# Patient Record
Sex: Female | Born: 1965 | Race: Black or African American | Hispanic: No | Marital: Married | Smoking: Never smoker
Health system: Southern US, Community
[De-identification: ages and names within clinical notes are randomized; demographics above are authoritative.]

## PROBLEM LIST (undated history)

## (undated) DIAGNOSIS — Z86711 Personal history of pulmonary embolism: Secondary | ICD-10-CM

## (undated) DIAGNOSIS — Z950 Presence of cardiac pacemaker: Secondary | ICD-10-CM

## (undated) DIAGNOSIS — I10 Essential (primary) hypertension: Secondary | ICD-10-CM

## (undated) DIAGNOSIS — J45909 Unspecified asthma, uncomplicated: Secondary | ICD-10-CM

## (undated) DIAGNOSIS — E66811 Obesity, class 1: Secondary | ICD-10-CM

## (undated) DIAGNOSIS — I455 Other specified heart block: Secondary | ICD-10-CM

## (undated) DIAGNOSIS — D869 Sarcoidosis, unspecified: Secondary | ICD-10-CM

## (undated) DIAGNOSIS — E119 Type 2 diabetes mellitus without complications: Secondary | ICD-10-CM

## (undated) DIAGNOSIS — E669 Obesity, unspecified: Secondary | ICD-10-CM

## (undated) DIAGNOSIS — Z7901 Long term (current) use of anticoagulants: Secondary | ICD-10-CM

## (undated) HISTORY — DX: Obesity, unspecified: E66.9

## (undated) HISTORY — DX: Long term (current) use of anticoagulants: Z79.01

## (undated) HISTORY — DX: Personal history of pulmonary embolism: Z86.711

## (undated) HISTORY — DX: Type 2 diabetes mellitus without complications: E11.9

## (undated) HISTORY — DX: Unspecified asthma, uncomplicated: J45.909

## (undated) HISTORY — PX: PACEMAKER IMPLANT: EP1218

## (undated) HISTORY — PX: ABDOMINAL HYSTERECTOMY: SHX81

## (undated) HISTORY — DX: Essential (primary) hypertension: I10

## (undated) HISTORY — DX: Obesity, class 1: E66.811

## (undated) HISTORY — PX: ANTERIOR CRUCIATE LIGAMENT REPAIR: SHX115

## (undated) HISTORY — PX: KNEE SURGERY: SHX244

## (undated) HISTORY — DX: Presence of cardiac pacemaker: Z95.0

## (undated) HISTORY — PX: PACEMAKER INSERTION: SHX728

## (undated) HISTORY — DX: Other specified heart block: I45.5

---

## 1997-07-09 ENCOUNTER — Observation Stay (HOSPITAL_COMMUNITY): Admission: AD | Admit: 1997-07-09 | Discharge: 1997-07-10 | Payer: Self-pay | Admitting: Obstetrics

## 1997-07-17 ENCOUNTER — Ambulatory Visit (HOSPITAL_COMMUNITY): Admission: RE | Admit: 1997-07-17 | Discharge: 1997-07-17 | Payer: Self-pay | Admitting: Obstetrics and Gynecology

## 1997-08-06 ENCOUNTER — Other Ambulatory Visit: Admission: RE | Admit: 1997-08-06 | Discharge: 1997-08-06 | Payer: Self-pay | Admitting: Obstetrics and Gynecology

## 1999-08-13 ENCOUNTER — Encounter: Payer: Self-pay | Admitting: Emergency Medicine

## 1999-08-13 ENCOUNTER — Emergency Department (HOSPITAL_COMMUNITY): Admission: EM | Admit: 1999-08-13 | Discharge: 1999-08-13 | Payer: Self-pay | Admitting: Emergency Medicine

## 1999-10-13 ENCOUNTER — Other Ambulatory Visit: Admission: RE | Admit: 1999-10-13 | Discharge: 1999-10-13 | Payer: Self-pay | Admitting: Obstetrics and Gynecology

## 1999-11-10 ENCOUNTER — Emergency Department (HOSPITAL_COMMUNITY): Admission: EM | Admit: 1999-11-10 | Discharge: 1999-11-10 | Payer: Self-pay | Admitting: Emergency Medicine

## 1999-11-10 ENCOUNTER — Encounter: Payer: Self-pay | Admitting: Emergency Medicine

## 2000-09-07 ENCOUNTER — Emergency Department (HOSPITAL_COMMUNITY): Admission: EM | Admit: 2000-09-07 | Discharge: 2000-09-07 | Payer: Self-pay | Admitting: Emergency Medicine

## 2000-12-16 ENCOUNTER — Encounter: Admission: RE | Admit: 2000-12-16 | Discharge: 2000-12-16 | Payer: Self-pay | Admitting: Internal Medicine

## 2000-12-16 ENCOUNTER — Encounter: Payer: Self-pay | Admitting: Internal Medicine

## 2001-01-12 ENCOUNTER — Other Ambulatory Visit: Admission: RE | Admit: 2001-01-12 | Discharge: 2001-01-12 | Payer: Self-pay | Admitting: Obstetrics and Gynecology

## 2001-03-08 ENCOUNTER — Emergency Department (HOSPITAL_COMMUNITY): Admission: EM | Admit: 2001-03-08 | Discharge: 2001-03-08 | Payer: Self-pay | Admitting: Emergency Medicine

## 2001-03-08 ENCOUNTER — Encounter: Payer: Self-pay | Admitting: Emergency Medicine

## 2001-06-07 ENCOUNTER — Encounter: Payer: Self-pay | Admitting: Emergency Medicine

## 2001-06-07 ENCOUNTER — Inpatient Hospital Stay (HOSPITAL_COMMUNITY): Admission: EM | Admit: 2001-06-07 | Discharge: 2001-06-13 | Payer: Self-pay | Admitting: Emergency Medicine

## 2001-06-10 ENCOUNTER — Encounter: Payer: Self-pay | Admitting: Internal Medicine

## 2002-05-02 ENCOUNTER — Encounter: Admission: RE | Admit: 2002-05-02 | Discharge: 2002-07-31 | Payer: Self-pay | Admitting: Internal Medicine

## 2002-06-22 ENCOUNTER — Other Ambulatory Visit: Admission: RE | Admit: 2002-06-22 | Discharge: 2002-06-22 | Payer: Self-pay | Admitting: Obstetrics and Gynecology

## 2003-02-04 ENCOUNTER — Ambulatory Visit (HOSPITAL_BASED_OUTPATIENT_CLINIC_OR_DEPARTMENT_OTHER): Admission: RE | Admit: 2003-02-04 | Discharge: 2003-02-04 | Payer: Self-pay | Admitting: Internal Medicine

## 2004-12-22 ENCOUNTER — Other Ambulatory Visit: Admission: RE | Admit: 2004-12-22 | Discharge: 2004-12-22 | Payer: Self-pay | Admitting: Obstetrics and Gynecology

## 2005-10-21 ENCOUNTER — Encounter (INDEPENDENT_AMBULATORY_CARE_PROVIDER_SITE_OTHER): Payer: Self-pay | Admitting: Specialist

## 2005-10-21 ENCOUNTER — Inpatient Hospital Stay (HOSPITAL_COMMUNITY): Admission: RE | Admit: 2005-10-21 | Discharge: 2005-10-23 | Payer: Self-pay | Admitting: Obstetrics and Gynecology

## 2007-02-28 ENCOUNTER — Encounter: Admission: RE | Admit: 2007-02-28 | Discharge: 2007-02-28 | Payer: Self-pay | Admitting: Internal Medicine

## 2010-08-08 NOTE — Discharge Summary (Signed)
Jennifer Richards, Jennifer Richards                ACCOUNT NO.:  [id number]   MEDICAL RECORD NO.:  [id number]          PATIENT TYPE:  INP   LOCATION:  9312                          FACILITY:  [facility]   PHYSICIAN:  Guy Sandifer. Henderson Cloud, M.D. DATE OF BIRTH:  06-26-1965   DATE OF ADMISSION:  10/21/2005  DATE OF DISCHARGE:  10/23/2005                                 DISCHARGE SUMMARY   ADMISSION DIAGNOSIS:  Leiomyomata.   DISCHARGE DIAGNOSES:  1.  Leiomyomata.  2.  Right ovarian cyst.   PROCEDURE:  On October 21, 2005, total abdominal hysterectomy with right  salpingo-oophorectomy.   REASON FOR ADMISSION:  This patient is a 45 year old single black female,  G1, P0 with a known uterine leiomyomata.  Details are dictated in the  history and physical.  She is admitted for surgical management.   HOSPITAL COURSE:  The patient was admitted to the hospital and undergoes the  above procedure.  That evening of surgery, she has good pain relief, no  nausea or vomiting, and is ambulating.  Vital signs are stable.  She is  afebrile, with clear urine output, although somewhat concentrated.  She is  given an IV fluid bolus.  On the first postoperative day, her Lovenox is  resumed.  She has compression stockings and sequential compression stockings  in place.  She remains afebrile with stable vital signs.  Hemoglobin is 8.4,  and white count is 3.9.  She is tolerating a regular diet and ambulating  well but not yet passing flatus.  On the day of discharge, she has good  bowel function, is feeling good, tolerating a regular diet, and is afebrile  with stable vital signs.  Incision is healing well.  Staples are removed,  and Steri-Strips are left in place.   CONDITION ON DISCHARGE:  Good.   DIET:  Regular, as tolerated.   ACTIVITY:  No lifting, no operation of motor vehicles, no vaginal entry.  She is to call the office for problems including but not limited to,  temperature of 101 degrees, heavy vaginal bleeding,  persistent nausea and  vomiting, or increasing pain.   DISCHARGE MEDICATIONS:  1.  Percocet 5/325 mg, #40, one to two p.o. q.6h. p.r.n.  2.  Chromagen, #30, one p.o. daily with one refill.  3.  She will resume Lovenox per Dr. Diamantina Providence orders.   FOLLOW UP:  Followup is in the office in two weeks with Dr. Henderson Cloud.      Guy Sandifer Henderson Cloud, M.D.  Electronically Signed     JET/MEDQ  D:  10/23/2005  T:  10/23/2005  Job:  [id number]

## 2010-08-08 NOTE — H&P (Signed)
Jennifer Richards, Jennifer Richards                ACCOUNT NO.:  [id number]   MEDICAL RECORD NO.:  [id number]          PATIENT TYPE:  AMB   LOCATION:  SDC                           [facility]ILITY:  [facility]   PHYSICIAN:  Guy Sandifer. Henderson Cloud, M.D. DATE OF BIRTH:  08-17-1965   DATE OF ADMISSION:  10/21/2005  DATE OF DISCHARGE:                                HISTORY & PHYSICAL   CHIEF COMPLAINT:  Uterine fibroids.   HISTORY OF PRESENT ILLNESS:  This patient is a 45 year old single black  female, G1, P0 with known uterine myelomata.  Ultrasound on 10/06/2005  revealed growth in her uterus which now measures 11.6 x 5.6 x 7.5 cm.  There  are multiple leiomyomata noted.  In addition, there is a 5.3-cm cystic mass  of the right ovary.  She has continued issues with painful heavy menses and  painful intercourse.  After discussion of the options, she is being admitted  for total abdominal hysterectomy and probable right salpingo-oophorectomy.  Potential risks and complications have been reviewed pre-operatively.  The  patient has a history of bilateral pulmonary embolism and has been  anticoagulated on Coumadin.  She has recently changed to Lovenox which was  discontinued prior to surgery.  Discussed with patient the increased risk of  a possibly fatal pulmonary embolism as well.  All questions answered.   PAST MEDICAL HISTORY:  1.  History of bilateral pulmonary embolism as above.  2.  Mitral valve prolapse.  3.  Pacemaker placed 1998.  4.  Chronic hypertension.  5.  Hyperlipidemia.  6.  Diabetes.   PAST SURGICAL HISTORY:  1.  Ectopic pregnancy, 1999.  2.  Pacemaker placed 1988.  3.  ACL reconstruction.   OBSTETRIC HISTORY:  Ectopic pregnancy x1.   FAMILY HISTORY:  Chronic hypertension in mother and father.   MEDICATIONS:  1.  Lovenox 80 mg.  2.  Benazepril.  3.  Metformin.   ALLERGIES:  No known drug allergies.   SOCIAL HISTORY:  Denies tobacco, alcohol or drug abuse.   REVIEW OF SYSTEMS:  NEURO:   Denies headache.  CARDIO:  Denies chest pain.  PULMONARY:  Denies shortness of breath.  GI:  Denies recent changes in bowel  habits.   PHYSICAL EXAMINATION:  VITAL SIGNS:  Height 5 feet 5-1/2 inches.  Weight 181-  1/2 pounds.  Blood pressure 124/88.  HEENT:  Without thyromegaly.  LUNGS:  Clear to auscultation.  HEART:  Regular rate and rhythm.  BACK:  Without CVA tenderness.  BREAST:  Without mass, traction, discharge.  ABDOMEN:  Soft, nontender without masses.  PELVIC:  Long vagina, cervix without lesion.  Uterus is enlarged and  irregular in contour, at least 10 weeks in size.  Adnexal exam compromised.  EXTREMITIES:  Grossly within normal limits.  NEUROLOGICAL:  Grossly within normal limits.   ASSESSMENT:  Uterine leiomyomata.   PLAN:  Total abdominal hysterectomy with probable right salpingo-  oophorectomy.      Guy Sandifer Henderson Cloud, M.D.  Electronically Signed     JET/MEDQ  D:  10/19/2005  T:  10/19/2005  Job:  [id number]

## 2010-08-08 NOTE — Op Note (Signed)
Jennifer Richards, Jennifer Richards                ACCOUNT NO.:  [id number]   MEDICAL RECORD NO.:  [id number]          PATIENT TYPE:  INP   LOCATION:  9312                          [facility]ILITY[facility]   PHYSICIAN:  Guy Sandifer. Henderson Cloud, M.D. DATE OF BIRTH:  1965/11/16   DATE OF PROCEDURE:  10/21/2005  DATE OF DISCHARGE:                                 OPERATIVE REPORT   PREOPERATIVE DIAGNOSIS:  Uterine leiomyomata.   POSTOPERATIVE DIAGNOSES:  1.  Leiomyomata.  2.  Right ovarian cyst.   PROCEDURE:  Total abdominal hysterectomy with right salpingo-oophorectomy.   SURGEON:  Guy Sandifer. Henderson Cloud, M.D.   ASSISTANT:  Duke Salvia. Marcelle Overlie, M.D.   ANESTHESIA:  General endotracheal intubation.   SPECIMENS:  Uterus, right tube and ovary to pathology.   ESTIMATED BLOOD LOSS:  350 mL.   INDICATIONS AND CONSENT:  This patient is a 45 year old single black female,  G1, P0 with known uterine leiomyomata.  Details are dictated in the history  and physical.  Total abdominal hysterectomy, probable right salpingo-  oophorectomy, possible left salpingo-oophorectomy if abnormal has been  discussed.  Potential risks and complications have been discussed  preoperatively including, but limited to, infection, bowel, bladder,  ureteral damage, bleeding requiring transfusion of blood products with  possible transfusion reaction, HIV and hepatitis acquisition, DVT, PE,  pneumonia.  All questions were answered and consent is signed on the chart.   PROCEDURE:  The patient is taken to the operating room where she is  identified, placed in dorsal supine position.  Thigh-high compression hose  as well as sequential compression stockings are  in placed.  General  anesthesia is then induced via endotracheal intubation.  She is then prepped  abdominally and vaginally, Foley catheter is placed and the bladder is  drained and she is draped in sterile fashion.  Pfannenstiel incision is made  and dissection is carried out in layers to the  peritoneum.  Peritoneum is  incised, extended superiorly and inferiorly.  O'Connor-O'Sullivan retractors  placed.  The bladder blade is placed.  The bowel is packed away and an  infant blade is placed.  Inspection reveals the left ovary to be small but  otherwise normal.  The right ovary contains a 4-5 cm smooth cyst.  It has  some filmy adhesions to the right pelvic sidewall.  The right fallopian tube  is also slightly distended consistent with a hydrosalpinx.  The uterus  contains a 5 cm subserosal fundal fibroid as well as a 8-10 cm fibroid on  the posterior lower uterine segment.  Clamps are placed on the proximal  ligaments.  The left round ligament is identified, ligated with 0 Monocryl.  All suture will be 0 Monocryl was otherwise designated.  Left round ligament  is taken down as well as the anterior leaf of the broad ligament.  The left  ureter is seen to be well clear of the area of surgery.  The posterior leaf  is perforated, proximal ligaments are crossclamped, taken down and the  pedicle is doubly ligated first free tie then with a suture.  The upper  branches  of the left uterine artery are then skeletonized and taken down.  The right round ligament is then on ligated and  taken down in a similar  fashion.  The anterior leaf of the broad ligament is taken down sharply.  Retroperitoneal space is bluntly dissected and the ureters identified well  clear of the area of surgery.  The right ovary is elevated with sharp and  blunt dissection.  The cyst remains intact.  The right infundibulopelvic  ligament is then doubly ligated with two free ties.  It is then taken down.  Progressive bites are taken bilaterally to control the uterine vessels.  In  order to create enough space for visualization, the fundal fibroid is then  sharply resected.  The posterior fibroid is also removed by essentially  doing a myomectomy.  This allows good visualization of the remaining cervix.  Progressive  bites are taken bilaterally.  The bladder is advanced sharply  and bluntly.  Curved clamps are then used to enter the vaginal fornix  bilaterally.  Cervix is then cut free and intact.  Cuff is closed with  figure-of-eights.  Uterosacral ligaments are plicated in the midline with a  single distal suture.  The left ovary is then ligated to the left round  ligament.  Copious irrigation is then carried out and careful inspection  reveals excellent hemostasis all around.  Packs are removed and anterior  peritoneum is closed in running fashion with 2-0 Monocryl suture which is  also used to reapproximate the pyramidalis muscle in the midline.  Anterior  rectus fascia is closed in running fashion with 0 PDS suture and the skin is  closed with clips.  All sponge, instrument  and needle counts are correct  and the patient is transferred to the recovery room in stable condition.      Guy Sandifer Henderson Cloud, M.D.  Electronically Signed     JET/MEDQ  D:  10/21/2005  T:  10/21/2005  Job:  [id number]

## 2010-08-15 LAB — HM DIABETES EYE EXAM

## 2012-02-22 ENCOUNTER — Ambulatory Visit (HOSPITAL_BASED_OUTPATIENT_CLINIC_OR_DEPARTMENT_OTHER): Payer: BC Managed Care – PPO | Attending: Internal Medicine | Admitting: Radiology

## 2012-02-22 VITALS — Ht 66.0 in | Wt 186.0 lb

## 2012-02-22 DIAGNOSIS — G4733 Obstructive sleep apnea (adult) (pediatric): Secondary | ICD-10-CM

## 2012-02-27 DIAGNOSIS — I491 Atrial premature depolarization: Secondary | ICD-10-CM

## 2012-02-27 DIAGNOSIS — R0989 Other specified symptoms and signs involving the circulatory and respiratory systems: Secondary | ICD-10-CM

## 2012-02-27 DIAGNOSIS — G4733 Obstructive sleep apnea (adult) (pediatric): Secondary | ICD-10-CM

## 2012-02-27 DIAGNOSIS — R0609 Other forms of dyspnea: Secondary | ICD-10-CM

## 2012-02-27 NOTE — Procedures (Cosign Needed)
NAMEDESERE, GWIN                ACCOUNT NO.:  [id number]  MEDICAL RECORD NO.:  [id number]          PATIENT TYPE:  OUT  LOCATION:  SLEEP CENTER                 [facility]ACILITY:  [facility]  PHYSICIAN:  Shanay Woolman D. Maple Hudson, MD, FCCP, FACPDATE OF BIRTH:  04/12/1965  DATE OF STUDY:  02/22/2012                           NOCTURNAL POLYSOMNOGRAM  REFERRING PHYSICIAN:  Minerva Areola L. August Saucer, M.D.  REFERRING PHYSICIAN:  Eric L. August Saucer, MD  INDICATION FOR STUDY:  Hypersomnia with sleep apnea.  EPWORTH SLEEPINESS SCORE:  7/24.  BMI 30.  Weight 186 pounds, height 66 inches, neck 14 inches.  MEDICATIONS:  Home medications charted and reviewed.  SLEEP ARCHITECTURE:  Total sleep time 368 minutes with sleep efficiency 93.3%.  Stage I was 12.5%, stage II 80.3%, stage III absent, REM 7.2% of total sleep time.  Sleep latency 11.5 minutes, REM latency 109 minutes. Awake after sleep onset 16 minutes.  Arousal index 16.  Bedtime medication:  NyQuil, Crestor.  RESPIRATORY DATA:  Apnea/hypopnea index (AHI) 11.6 per hour.  A total of 71 events was scored including 29 obstructive apneas, 4 central apneas, 38 hypopneas.  Events were seen in all sleep positions, especially supine.  REM AHI 49.8 per hour.  The study was ordered as a diagnostic NPSG protocol and CPAP titration was not done.  OXYGEN DATA:  Moderate snoring with oxygen desaturation to a nadir of 81% and mean oxygen saturation through the study of 97.3% on room air.  CARDIAC DATA:  Sinus rhythm with occasional PAC.  MOVEMENT/PARASOMNIA:  No significant movement disturbance.  No bathroom trips.  IMPRESSION/RECOMMENDATION: 1. Mild obstructive sleep apnea/hypopnea syndrome, AHI 11.6 per hour.     Events in all sleep positions.  Moderately loud snoring with oxygen     desaturation to a nadir of 81% and mean oxygen saturation through     the study of 97.3% on room air. 2. This study was ordered as a diagnostic NPSG protocol without CPAP.     If appropriate, she  can return for a dedicated CPAP titration     study.     Elianis Fischbach D. Maple Hudson, MD, [facility], FACP Diplomate, American Board of Sleep Medicine    CDY/MEDQ  D:  02/27/2012 11:55:17  T:  02/27/2012 17:52:11  Job:  [id number]

## 2012-08-01 ENCOUNTER — Encounter: Payer: Self-pay | Admitting: Hematology

## 2014-04-20 ENCOUNTER — Ambulatory Visit
Admission: RE | Admit: 2014-04-20 | Discharge: 2014-04-20 | Disposition: A | Discharge status: Home / Self Care | Payer: BC Managed Care – PPO | Source: Ambulatory Visit | Attending: Internal Medicine | Admitting: Internal Medicine

## 2014-04-20 ENCOUNTER — Other Ambulatory Visit: Payer: Self-pay | Admitting: Internal Medicine

## 2014-04-20 DIAGNOSIS — M25511 Pain in right shoulder: Secondary | ICD-10-CM

## 2016-08-20 ENCOUNTER — Emergency Department (HOSPITAL_COMMUNITY): Payer: BC Managed Care – PPO

## 2016-08-20 ENCOUNTER — Emergency Department (HOSPITAL_COMMUNITY)
Admission: EM | Admit: 2016-08-20 | Discharge: 2016-08-20 | Disposition: A | Discharge status: Home / Self Care | Payer: BC Managed Care – PPO | Attending: Emergency Medicine | Admitting: Emergency Medicine

## 2016-08-20 ENCOUNTER — Encounter (HOSPITAL_COMMUNITY): Payer: Self-pay

## 2016-08-20 DIAGNOSIS — Y9289 Other specified places as the place of occurrence of the external cause: Secondary | ICD-10-CM | POA: Insufficient documentation

## 2016-08-20 DIAGNOSIS — Z7984 Long term (current) use of oral hypoglycemic drugs: Secondary | ICD-10-CM | POA: Insufficient documentation

## 2016-08-20 DIAGNOSIS — I10 Essential (primary) hypertension: Secondary | ICD-10-CM | POA: Diagnosis not present

## 2016-08-20 DIAGNOSIS — Z79899 Other long term (current) drug therapy: Secondary | ICD-10-CM | POA: Insufficient documentation

## 2016-08-20 DIAGNOSIS — Y999 Unspecified external cause status: Secondary | ICD-10-CM | POA: Insufficient documentation

## 2016-08-20 DIAGNOSIS — E119 Type 2 diabetes mellitus without complications: Secondary | ICD-10-CM | POA: Insufficient documentation

## 2016-08-20 DIAGNOSIS — Y9389 Activity, other specified: Secondary | ICD-10-CM | POA: Insufficient documentation

## 2016-08-20 DIAGNOSIS — Z7901 Long term (current) use of anticoagulants: Secondary | ICD-10-CM | POA: Diagnosis not present

## 2016-08-20 DIAGNOSIS — S99922A Unspecified injury of left foot, initial encounter: Secondary | ICD-10-CM | POA: Diagnosis present

## 2016-08-20 DIAGNOSIS — J45909 Unspecified asthma, uncomplicated: Secondary | ICD-10-CM | POA: Diagnosis not present

## 2016-08-20 DIAGNOSIS — X509XXA Other and unspecified overexertion or strenuous movements or postures, initial encounter: Secondary | ICD-10-CM | POA: Insufficient documentation

## 2016-08-20 DIAGNOSIS — S93602A Unspecified sprain of left foot, initial encounter: Secondary | ICD-10-CM | POA: Insufficient documentation

## 2016-08-20 NOTE — ED Triage Notes (Signed)
PT was at gym last night doing burpee and felt "pop" in left foot. Pt is still having pain in left heel and arch. She was ambulatory but with pain.

## 2016-08-20 NOTE — Discharge Instructions (Signed)
You have been seen today for a foot injury. There were no acute abnormalities on the x-rays, including no sign of fracture or dislocation. Pain: If you are able to do so, take 400 mg of ibuprofen every 6 hours for the next 3 days. After this time, this medication may be used as needed for pain. Take this medication with food to avoid upset stomach.  Ice: May apply ice to the area over the next 24 hours for 15 minutes at a time to reduce swelling. Elevation: Keep the extremity elevated as often as possible to reduce pain and inflammation. Support: Wear the boot for support and comfort. Wear this until cleared by the podiatrist.  Follow up: Follow up with foot specialist as soon as possible. Call the number provided to set up appointment.

## 2016-08-20 NOTE — ED Provider Notes (Signed)
[facility] DEPT Provider Note   CSN: [id number] Arrival date & time: 08/20/16  0940  By signing my name below, I, Jennifer Richards, attest that this documentation has been prepared under the direction and in the presence of Andrya Roppolo, PA-C.  Electronically Signed: Reola Richards, ED Scribe. 08/20/16. 11:44 AM.  History   Chief Complaint Chief Complaint  Patient presents with  . Foot Pain   The history is provided by the patient. No language interpreter was used.    HPI Comments: Jennifer Richards is a 51 y.o. female who presents to the Emergency Department complaining of sudden onset, improving, moderate dorsal midfoot pain beginning last night. She notes some radiation into the heel of the left foot. She began a new workout regimen at the gym performing "modified burpee" exercises last night when she felt a small "popping" sensation to the left foot. Her pain is improved with wearing a tight shoe with a supportive arch and worse with walking barefoot. She denies weakness, numbness, or any other associated symptoms.   Past Medical History:  Diagnosis Date  . Anticoagulated on Coumadin   . Asthma   . Asthmatic bronchitis   . Diabetes mellitus without complication ([place])   . Hx of pulmonary embolus   . Hypertension   . Obesity (BMI 30.0-34.9)   . Pacemaker   . Sinus arrest    There are no active problems to display for this patient.  Past Surgical History:  Procedure Laterality Date  . ABDOMINAL HYSTERECTOMY    . ANTERIOR CRUCIATE LIGAMENT REPAIR     right knee  . PACEMAKER INSERTION     OB History    No data available     Home Medications    Prior to Admission medications   Medication Sig Start Date End Date Taking? Authorizing Provider  beclomethasone (QVAR) 80 MCG/ACT inhaler Inhale 2 puffs into the lungs 2 (two) times daily.    [provider]  benazepril-hydrochlorthiazide (LOTENSIN HCT) 20-25 MG per tablet Take 1 tablet by mouth daily.     [provider]  loratadine (CLARITIN) 10 MG tablet Take 10 mg by mouth daily.    [provider]  metFORMIN (GLUCOPHAGE) 850 MG tablet Take 850 mg by mouth 2 (two) times daily with a meal.    [provider]  potassium chloride SA (K-DUR,KLOR-CON) 20 MEQ tablet Take 20 mEq by mouth daily.    [provider]  Prenatal Vit-Fe Fumarate-FA (M-VIT) tablet Take 1 tablet by mouth daily.    [provider]  rosuvastatin (CRESTOR) 10 MG tablet Take 10 mg by mouth daily.    [provider]  warfarin (COUMADIN) 5 MG tablet Take 5 mg by mouth daily.    [provider]   Family History No family history on file.  Social History Social History  Substance Use Topics  . Smoking status: Never Smoker  . Smokeless tobacco: Never Used  . Alcohol use No   Allergies   Patient has no known allergies.  Review of Systems Review of Systems  Musculoskeletal: Positive for myalgias. Negative for arthralgias and joint swelling.  Skin: Negative for wound.  Neurological: Negative for weakness and numbness.   Physical Exam Updated Vital Signs BP 122/67 (BP Location: Right Arm)   Pulse 66   Temp 98.2 F (36.8 C) (Oral)   Resp 14   Ht 5\' 6"  (1.676 m)   Wt 84.4 kg (186 lb)   SpO2 100%   BMI 30.02 kg/m  Physical Exam  Constitutional: She appears well-developed and well-nourished. No distress.  HENT:  Head: Normocephalic and atraumatic.  Eyes: Conjunctivae are normal.  Neck: Neck supple.  Cardiovascular: Normal rate, regular rhythm and intact distal pulses.   Pulmonary/Chest: Effort normal.  Musculoskeletal: She exhibits tenderness. She exhibits no edema or deformity.  Tenderness to the left plantar midfoot extending into the heel. Significant increase in pain w/ dorsiflexion of the toes.  Patient's arch appears to be intact. When asked to stand on her toes, patient is less steady on the left side, but can push upward onto her toes.    Neurological: She is alert.  Skin: Skin is warm and dry. Capillary refill takes less than 2 seconds. She is not diaphoretic.  Psychiatric: She has a normal mood and affect. Her behavior is normal.  Nursing note and vitals reviewed.  ED Treatments / Results  DIAGNOSTIC STUDIES: Oxygen Saturation is 98% on RA, normal by my interpretation.   COORDINATION OF CARE: 11:44 AM-Discussed next steps with pt. Pt verbalized understanding and is agreeable with the plan.   Labs (all labs ordered are listed, but only abnormal results are displayed) Labs Reviewed - No data to display  EKG  EKG Interpretation None      Radiology Dg Foot Complete Left  Result Date: 08/20/2016 CLINICAL DATA:  Left foot pain after injury at gym last night. EXAM: LEFT FOOT - COMPLETE 3+ VIEW COMPARISON:  None. FINDINGS: There is no evidence of fracture or dislocation. There is no evidence of arthropathy or other focal bone abnormality. Soft tissues are unremarkable. IMPRESSION: Normal left foot. Electronically Signed   By: Marijo Conception, M.D.   On: 08/20/2016 10:43   Procedures Procedures   Medications Ordered in ED Medications - No data to display  Initial Impression / Assessment and Plan / ED Course  I have reviewed the triage vital signs and the nursing notes.  Pertinent labs & imaging results that were available during my care of the patient were reviewed by me and considered in my medical decision making (see chart for details).     Patient presents with right foot pain. No acute abnormalities on x-ray, however, there is still the possibility of the Lisfranc ligamentous injury. Cam Walker and crutches provided. Podiatry follow-up. The patient was given instructions for home care as well as return precautions. Patient voices understanding of these instructions, accepts the plan, and is comfortable with discharge.  Final Clinical Impressions(s) / ED Diagnoses   Final diagnoses:  Sprain of left foot,  initial encounter   New Prescriptions Discharge Medication List as of 08/20/2016 11:59 AM     I personally performed the services described in this documentation, which was scribed in my presence. The recorded information has been reviewed and is accurate.     [name], PA-C 08/20/16 1619    [name], PA-C 08/20/16 1619    Milton Ferguson, MD 08/22/16 [phone]

## 2016-08-20 NOTE — Progress Notes (Signed)
Orthopedic Tech Progress Note Patient Details:  Jennifer Richards December 27, 1965 [id number]  Ortho Devices Type of Ortho Device: CAM walker, Crutches Ortho Device/Splint Location: lle Ortho Device/Splint Interventions: Application   Jennifer Richards 08/20/2016, 12:18 PM

## 2016-08-20 NOTE — ED Notes (Signed)
Patient transported to X-ray 

## 2016-08-20 NOTE — ED Notes (Signed)
Patient returned from xray.

## 2016-08-24 ENCOUNTER — Encounter: Payer: Self-pay | Admitting: Podiatry

## 2016-08-24 ENCOUNTER — Ambulatory Visit (INDEPENDENT_AMBULATORY_CARE_PROVIDER_SITE_OTHER): Payer: BC Managed Care – PPO | Admitting: Podiatry

## 2016-08-24 DIAGNOSIS — M79672 Pain in left foot: Secondary | ICD-10-CM

## 2016-08-24 DIAGNOSIS — S93692A Other sprain of left foot, initial encounter: Secondary | ICD-10-CM

## 2016-08-24 DIAGNOSIS — M799 Soft tissue disorder, unspecified: Secondary | ICD-10-CM

## 2016-08-24 MED ORDER — MELOXICAM 15 MG PO TABS: 15.0000 mg | ORAL_TABLET | Freq: Every day | ORAL | 0 refills | Status: DC

## 2016-08-24 NOTE — Progress Notes (Signed)
Patient ID: Jennifer Richards, female   DOB: October 05, 1965, 51 y.o.   MRN: [id number]   HPI:  51 year old female presents the office today for evaluation of left foot pain. Patient was exercising at the gym performing Burpee's and she noticed that pop to the middle arch of the left heel. Patient immediately noticed sharp shooting pains. Patient went to the emergency department on 08/20/2016. The patient has been unable to bear weight on the left lower extremity without severe pain. Patient presents today wearing a large tall cam boot. She states the boot irritates her.    Physical Exam: General: The patient is alert and oriented x3 in no acute distress.  Dermatology: Skin is warm, dry and supple bilateral lower extremities. Negative for open lesions or macerations.  Vascular: Palpable pedal pulses bilaterally. No edema or erythema noted. Capillary refill within normal limits.  Neurological: Epicritic and protective threshold grossly intact bilaterally.   Musculoskeletal Exam: Significant pain on palpation to the medial calcaneal tubercle left lower extremity. There is surrounding soft tissue edema noted.  Assessment: 1. Possible plantar fascial rupture left lower extremity 2. Possible abductor hallucis muscle rupture left   Plan of Care:  1. Patient was evaluated. 2. Compression anklet dispensed 3. Today a short cam boot was dispensed 4. Prescription for meloxicam 15 mg 5. Return to clinic in 4 weeks  Patient exercises at [employer] with Lamar Blinks, and Leticia Penna, DPM [facility]  Dr. Edrick Kins, [place]                                        [place], [place] [address]                Office [phone]  Fax [phone]

## 2016-08-24 NOTE — Progress Notes (Signed)
   Subjective:    Patient ID: Jennifer Richards, female    DOB: 03/14/1966, 51 y.o.   MRN: [id number]  HPI   I was working out at the gym on 08/20/16 and hurt the middle arch to the left heel and was doing an exercise called up and down and hurt to bend my toes and went to the ER the next day and had x-rays and soaked it but did not help and I am in a boot    Review of Systems  All other systems reviewed and are negative.      Objective:   Physical Exam        Assessment & Plan:

## 2016-09-21 ENCOUNTER — Ambulatory Visit (INDEPENDENT_AMBULATORY_CARE_PROVIDER_SITE_OTHER): Payer: BC Managed Care – PPO | Admitting: Podiatry

## 2016-09-21 ENCOUNTER — Ambulatory Visit (INDEPENDENT_AMBULATORY_CARE_PROVIDER_SITE_OTHER): Payer: BC Managed Care – PPO

## 2016-09-21 ENCOUNTER — Encounter: Payer: Self-pay | Admitting: Podiatry

## 2016-09-21 DIAGNOSIS — R52 Pain, unspecified: Secondary | ICD-10-CM

## 2016-09-21 DIAGNOSIS — S93692A Other sprain of left foot, initial encounter: Secondary | ICD-10-CM | POA: Diagnosis not present

## 2016-09-23 NOTE — Progress Notes (Signed)
Patient ID: Jennifer Richards, female   DOB: 1965-05-08, 51 y.o.   MRN: [id number]   HPI:  51 year old female presents the office today for follow up evaluation of left foot pain. She states her pain has improved. She denies any new complaints at this time.   Physical Exam: General: The patient is alert and oriented x3 in no acute distress.  Dermatology: Skin is warm, dry and supple bilateral lower extremities. Negative for open lesions or macerations.  Vascular: Palpable pedal pulses bilaterally. No edema or erythema noted. Capillary refill within normal limits.  Neurological: Epicritic and protective threshold grossly intact bilaterally.   Musculoskeletal Exam: Negative for pain on palpation to the medial calcaneal tubercle left lower extremity.   Assessment: 1. Possible plantar fascial rupture left lower extremity-resolved 2. Possible abductor hallucis muscle rupture left-resolved   Plan of Care:  1. Patient was evaluated. 2. Discontinue wearing CAM boot. 3. Slowly increase activity. 4. Return to clinic when necessary.  Patient exercises at [employer] with Lamar Blinks, and Leticia Penna, DPM [facility]  Dr. Edrick Kins, [address]                                        [place], [place] [address]                Office [phone]  Fax [phone]

## 2016-11-27 DIAGNOSIS — H04123 Dry eye syndrome of bilateral lacrimal glands: Secondary | ICD-10-CM | POA: Insufficient documentation

## 2016-11-27 DIAGNOSIS — Z7901 Long term (current) use of anticoagulants: Secondary | ICD-10-CM | POA: Insufficient documentation

## 2016-11-27 DIAGNOSIS — R682 Dry mouth, unspecified: Secondary | ICD-10-CM | POA: Insufficient documentation

## 2018-07-10 ENCOUNTER — Inpatient Hospital Stay (HOSPITAL_COMMUNITY)
Admission: EM | Admit: 2018-07-10 | Discharge: 2018-07-13 | DRG: 025 | Disposition: A | Discharge status: Home Health | Payer: BC Managed Care – PPO | Attending: Pulmonary Disease | Admitting: Pulmonary Disease

## 2018-07-10 ENCOUNTER — Emergency Department (HOSPITAL_COMMUNITY): Payer: BC Managed Care – PPO | Admitting: Certified Registered Nurse Anesthetist

## 2018-07-10 ENCOUNTER — Emergency Department (HOSPITAL_COMMUNITY): Payer: BC Managed Care – PPO

## 2018-07-10 ENCOUNTER — Encounter (HOSPITAL_COMMUNITY)
Admission: EM | Disposition: A | Discharge status: Home Health | Payer: Self-pay | Source: Home / Self Care | Attending: Neurosurgery

## 2018-07-10 ENCOUNTER — Other Ambulatory Visit: Payer: Self-pay

## 2018-07-10 ENCOUNTER — Inpatient Hospital Stay (HOSPITAL_COMMUNITY): Payer: BC Managed Care – PPO

## 2018-07-10 ENCOUNTER — Encounter (HOSPITAL_COMMUNITY): Payer: Self-pay

## 2018-07-10 DIAGNOSIS — R4020 Unspecified coma: Secondary | ICD-10-CM | POA: Diagnosis present

## 2018-07-10 DIAGNOSIS — I6200 Nontraumatic subdural hemorrhage, unspecified: Secondary | ICD-10-CM | POA: Diagnosis present

## 2018-07-10 DIAGNOSIS — Z7984 Long term (current) use of oral hypoglycemic drugs: Secondary | ICD-10-CM

## 2018-07-10 DIAGNOSIS — D689 Coagulation defect, unspecified: Secondary | ICD-10-CM | POA: Diagnosis present

## 2018-07-10 DIAGNOSIS — E876 Hypokalemia: Secondary | ICD-10-CM | POA: Diagnosis present

## 2018-07-10 DIAGNOSIS — S065X9A Traumatic subdural hemorrhage with loss of consciousness of unspecified duration, initial encounter: Secondary | ICD-10-CM | POA: Diagnosis not present

## 2018-07-10 DIAGNOSIS — Z4659 Encounter for fitting and adjustment of other gastrointestinal appliance and device: Secondary | ICD-10-CM

## 2018-07-10 DIAGNOSIS — Z7901 Long term (current) use of anticoagulants: Secondary | ICD-10-CM

## 2018-07-10 DIAGNOSIS — E785 Hyperlipidemia, unspecified: Secondary | ICD-10-CM | POA: Diagnosis present

## 2018-07-10 DIAGNOSIS — R001 Bradycardia, unspecified: Secondary | ICD-10-CM | POA: Diagnosis present

## 2018-07-10 DIAGNOSIS — D6489 Other specified anemias: Secondary | ICD-10-CM | POA: Diagnosis present

## 2018-07-10 DIAGNOSIS — H5704 Mydriasis: Secondary | ICD-10-CM | POA: Diagnosis present

## 2018-07-10 DIAGNOSIS — Z86718 Personal history of other venous thrombosis and embolism: Secondary | ICD-10-CM | POA: Diagnosis not present

## 2018-07-10 DIAGNOSIS — Z86711 Personal history of pulmonary embolism: Secondary | ICD-10-CM | POA: Diagnosis not present

## 2018-07-10 DIAGNOSIS — J988 Other specified respiratory disorders: Secondary | ICD-10-CM | POA: Diagnosis not present

## 2018-07-10 DIAGNOSIS — Z950 Presence of cardiac pacemaker: Secondary | ICD-10-CM | POA: Diagnosis not present

## 2018-07-10 DIAGNOSIS — I609 Nontraumatic subarachnoid hemorrhage, unspecified: Secondary | ICD-10-CM | POA: Diagnosis present

## 2018-07-10 DIAGNOSIS — I10 Essential (primary) hypertension: Secondary | ICD-10-CM | POA: Diagnosis present

## 2018-07-10 DIAGNOSIS — S065XAA Traumatic subdural hemorrhage with loss of consciousness status unknown, initial encounter: Secondary | ICD-10-CM | POA: Diagnosis present

## 2018-07-10 DIAGNOSIS — E1165 Type 2 diabetes mellitus with hyperglycemia: Secondary | ICD-10-CM | POA: Diagnosis present

## 2018-07-10 DIAGNOSIS — J9601 Acute respiratory failure with hypoxia: Secondary | ICD-10-CM | POA: Diagnosis present

## 2018-07-10 DIAGNOSIS — Z79899 Other long term (current) drug therapy: Secondary | ICD-10-CM | POA: Diagnosis not present

## 2018-07-10 HISTORY — PX: CRANIOTOMY: SHX93

## 2018-07-10 LAB — DIFFERENTIAL
Abs Immature Granulocytes: 0.04 10*3/uL (ref 0.00–0.07)
Basophils Absolute: 0 10*3/uL (ref 0.0–0.1)
Basophils Relative: 0 %
Eosinophils Absolute: 0 10*3/uL (ref 0.0–0.5)
Eosinophils Relative: 0 %
Immature Granulocytes: 1 %
Lymphocytes Relative: 10 %
Lymphs Abs: 0.8 10*3/uL (ref 0.7–4.0)
Monocytes Absolute: 0.5 10*3/uL (ref 0.1–1.0)
Monocytes Relative: 5 %
Neutro Abs: 7.5 10*3/uL (ref 1.7–7.7)
Neutrophils Relative %: 84 %

## 2018-07-10 LAB — URINALYSIS, ROUTINE W REFLEX MICROSCOPIC
Bacteria, UA: NONE SEEN
Bilirubin Urine: NEGATIVE
Glucose, UA: 150 mg/dL — AB
Hgb urine dipstick: NEGATIVE
Ketones, ur: 5 mg/dL — AB
Leukocytes,Ua: NEGATIVE
Nitrite: NEGATIVE
Protein, ur: >=300 mg/dL — AB
RBC / HPF: >50 RBC/hpf — ABNORMAL HIGH (ref 0–5)
Specific Gravity, Urine: 1.033 — ABNORMAL HIGH (ref 1.005–1.030)
pH: 7 (ref 5.0–8.0)

## 2018-07-10 LAB — GLUCOSE, CAPILLARY
Glucose-Capillary: 199 mg/dL — ABNORMAL HIGH (ref 70–99)
Glucose-Capillary: 135 mg/dL — ABNORMAL HIGH (ref 70–99)
Glucose-Capillary: 142 mg/dL — ABNORMAL HIGH (ref 70–99)

## 2018-07-10 LAB — BPAM FFP
Blood Product Expiration Date: 202004232359
Blood Product Expiration Date: 202004242359
ISSUE DATE / TIME: 202004190911
ISSUE DATE / TIME: 202004190926
Unit Type and Rh: 6200
Unit Type and Rh: 6200

## 2018-07-10 LAB — COMPREHENSIVE METABOLIC PANEL
ALT: 36 U/L (ref 0–44)
AST: 18 U/L (ref 15–41)
Albumin: 2.9 g/dL — ABNORMAL LOW (ref 3.5–5.0)
Alkaline Phosphatase: 111 U/L (ref 38–126)
Anion gap: 13 (ref 5–15)
BUN: 11 mg/dL (ref 6–20)
CO2: 23 mmol/L (ref 22–32)
Calcium: 9.2 mg/dL (ref 8.9–10.3)
Chloride: 100 mmol/L (ref 98–111)
Creatinine, Ser: 0.78 mg/dL (ref 0.44–1.00)
GFR calc Af Amer: >60 mL/min (ref >60)
GFR calc non Af Amer: >60 mL/min (ref >60)
Glucose, Bld: 270 mg/dL — ABNORMAL HIGH (ref 70–99)
Potassium: 3.5 mmol/L (ref 3.5–5.1)
Sodium: 136 mmol/L (ref 135–145)
Total Bilirubin: 0.4 mg/dL (ref 0.3–1.2)
Total Protein: 6.9 g/dL (ref 6.5–8.1)

## 2018-07-10 LAB — SARS CORONAVIRUS 2 BY RT PCR (HOSPITAL ORDER, PERFORMED IN ~~LOC~~ HOSPITAL LAB): SARS Coronavirus 2: POSITIVE — AB

## 2018-07-10 LAB — RAPID URINE DRUG SCREEN, HOSP PERFORMED
Amphetamines: NOT DETECTED
Barbiturates: NOT DETECTED
Benzodiazepines: POSITIVE — AB
Cocaine: NOT DETECTED
Opiates: NOT DETECTED
Tetrahydrocannabinol: NOT DETECTED

## 2018-07-10 LAB — PROTIME-INR
INR: 6.1 (ref 0.8–1.2)
INR: 1.5 — ABNORMAL HIGH (ref 0.8–1.2)
INR: 1.2 (ref 0.8–1.2)
Prothrombin Time: 53.1 seconds — ABNORMAL HIGH (ref 11.4–15.2)
Prothrombin Time: 17.4 seconds — ABNORMAL HIGH (ref 11.4–15.2)
Prothrombin Time: 15 seconds (ref 11.4–15.2)

## 2018-07-10 LAB — CBC
HCT: 35.9 % — ABNORMAL LOW (ref 36.0–46.0)
Hemoglobin: 11.1 g/dL — ABNORMAL LOW (ref 12.0–15.0)
MCH: 25.7 pg — ABNORMAL LOW (ref 26.0–34.0)
MCHC: 30.9 g/dL (ref 30.0–36.0)
MCV: 83.1 fL (ref 80.0–100.0)
Platelets: 442 10*3/uL — ABNORMAL HIGH (ref 150–400)
RBC: 4.32 MIL/uL (ref 3.87–5.11)
RDW: 12.6 % (ref 11.5–15.5)
WBC: 8.9 10*3/uL (ref 4.0–10.5)
nRBC: 0 % (ref 0.0–0.2)

## 2018-07-10 LAB — MRSA PCR SCREENING: MRSA by PCR: NEGATIVE

## 2018-07-10 LAB — ETHANOL: Alcohol, Ethyl (B): <10 mg/dL (ref <10)

## 2018-07-10 LAB — PREPARE FRESH FROZEN PLASMA
Unit division: 0
Unit division: 0

## 2018-07-10 LAB — CBG MONITORING, ED: Glucose-Capillary: 248 mg/dL — ABNORMAL HIGH (ref 70–99)

## 2018-07-10 LAB — ABO/RH: ABO/RH(D): A NEG

## 2018-07-10 LAB — APTT: aPTT: 65 seconds — ABNORMAL HIGH (ref 24–36)

## 2018-07-10 LAB — I-STAT CREATININE, ED: Creatinine, Ser: 0.7 mg/dL (ref 0.44–1.00)

## 2018-07-10 LAB — I-STAT BETA HCG BLOOD, ED (MC, WL, AP ONLY): I-stat hCG, quantitative: <5 m[IU]/mL (ref <5)

## 2018-07-10 SURGERY — CRANIOTOMY HEMATOMA EVACUATION SUBDURAL
Anesthesia: General | Site: Head | Laterality: Right

## 2018-07-10 MED ORDER — BUPIVACAINE-EPINEPHRINE 0.5% -1:200000 IJ SOLN
INTRAMUSCULAR | Status: DC | PRN
  Administered 2018-07-10: 8 mL

## 2018-07-10 MED ORDER — ROSUVASTATIN CALCIUM 5 MG PO TABS
10.0000 mg | ORAL_TABLET | Freq: Every day | ORAL | Status: DC
  Administered 2018-07-12: 22:00:00 10 mg via ORAL
  Filled 2018-07-10 (×3): qty 2

## 2018-07-10 MED ORDER — PROTHROMBIN COMPLEX CONC HUMAN 500 UNITS IV KIT
1697.0000 [IU] | PACK | Freq: Once | Status: AC
  Administered 2018-07-10: 09:00:00 1697 [IU] via INTRAVENOUS
  Filled 2018-07-10: qty 1197

## 2018-07-10 MED ORDER — PANTOPRAZOLE SODIUM 40 MG IV SOLR
40.0000 mg | Freq: Every day | INTRAVENOUS | Status: DC
  Administered 2018-07-10 – 2018-07-11 (×2): 40 mg via INTRAVENOUS
  Filled 2018-07-10 (×2): qty 40

## 2018-07-10 MED ORDER — FENTANYL CITRATE (PF) 100 MCG/2ML IJ SOLN
100.0000 ug | INTRAMUSCULAR | Status: DC | PRN
  Administered 2018-07-10 (×4): 100 ug via INTRAVENOUS
  Administered 2018-07-10: 50 ug via INTRAVENOUS
  Administered 2018-07-10 – 2018-07-11 (×4): 100 ug via INTRAVENOUS
  Filled 2018-07-10 (×6): qty 2

## 2018-07-10 MED ORDER — ETOMIDATE 2 MG/ML IV SOLN
INTRAVENOUS | Status: AC | PRN
  Administered 2018-07-10: 20 mg via INTRAVENOUS

## 2018-07-10 MED ORDER — LIDOCAINE-EPINEPHRINE 1 %-1:100000 IJ SOLN
INTRAMUSCULAR | Status: AC
  Filled 2018-07-10: qty 1

## 2018-07-10 MED ORDER — THROMBIN 5000 UNITS EX SOLR
OROMUCOSAL | Status: DC | PRN
  Administered 2018-07-10: 5 mL via TOPICAL

## 2018-07-10 MED ORDER — CHLORHEXIDINE GLUCONATE 0.12% ORAL RINSE (MEDLINE KIT)
15.0000 mL | Freq: Two times a day (BID) | OROMUCOSAL | Status: DC
  Administered 2018-07-10 – 2018-07-11 (×2): 15 mL via OROMUCOSAL

## 2018-07-10 MED ORDER — ORAL CARE MOUTH RINSE
15.0000 mL | OROMUCOSAL | Status: DC
  Administered 2018-07-10 – 2018-07-11 (×10): 15 mL via OROMUCOSAL

## 2018-07-10 MED ORDER — SUCCINYLCHOLINE CHLORIDE 20 MG/ML IJ SOLN
INTRAMUSCULAR | Status: AC | PRN
  Administered 2018-07-10: 100 mg via INTRAVENOUS

## 2018-07-10 MED ORDER — PROMETHAZINE HCL 25 MG PO TABS: 12.5000 mg | ORAL_TABLET | ORAL | Status: DC | PRN

## 2018-07-10 MED ORDER — LEVETIRACETAM IN NACL 500 MG/100ML IV SOLN
500.0000 mg | Freq: Two times a day (BID) | INTRAVENOUS | Status: DC
  Administered 2018-07-10 – 2018-07-12 (×5): 500 mg via INTRAVENOUS
  Filled 2018-07-10 (×5): qty 100

## 2018-07-10 MED ORDER — BUPIVACAINE HCL (PF) 0.5 % IJ SOLN
INTRAMUSCULAR | Status: AC
  Filled 2018-07-10: qty 30

## 2018-07-10 MED ORDER — BISACODYL 10 MG RE SUPP: 10.0000 mg | Freq: Every day | RECTAL | Status: DC | PRN

## 2018-07-10 MED ORDER — FENTANYL CITRATE (PF) 250 MCG/5ML IJ SOLN
INTRAMUSCULAR | Status: AC
  Filled 2018-07-10: qty 5

## 2018-07-10 MED ORDER — FENTANYL CITRATE (PF) 100 MCG/2ML IJ SOLN
100.0000 ug | INTRAMUSCULAR | Status: AC | PRN
  Administered 2018-07-10 (×3): 100 ug via INTRAVENOUS
  Filled 2018-07-10 (×3): qty 2

## 2018-07-10 MED ORDER — VITAMIN K1 10 MG/ML IJ SOLN
10.0000 mg | Freq: Once | INTRAVENOUS | Status: AC
  Administered 2018-07-10: 10:00:00 10 mg via INTRAVENOUS
  Filled 2018-07-10: qty 1

## 2018-07-10 MED ORDER — ROCURONIUM BROMIDE 100 MG/10ML IV SOLN
INTRAVENOUS | Status: DC | PRN
  Administered 2018-07-10: 50 mg via INTRAVENOUS

## 2018-07-10 MED ORDER — LORATADINE 10 MG PO TABS
10.0000 mg | ORAL_TABLET | Freq: Every day | ORAL | Status: DC
  Administered 2018-07-10 – 2018-07-13 (×4): 10 mg via ORAL
  Filled 2018-07-10 (×4): qty 1

## 2018-07-10 MED ORDER — SODIUM CHLORIDE 0.9 % IV SOLN
INTRAVENOUS | Status: DC | PRN
  Administered 2018-07-10: 17:00:00 1000 mL via INTRAVENOUS

## 2018-07-10 MED ORDER — BACITRACIN ZINC 500 UNIT/GM EX OINT
TOPICAL_OINTMENT | CUTANEOUS | Status: AC
  Filled 2018-07-10: qty 28.35

## 2018-07-10 MED ORDER — ONDANSETRON HCL 4 MG PO TABS: 4.0000 mg | ORAL_TABLET | ORAL | Status: DC | PRN

## 2018-07-10 MED ORDER — MORPHINE SULFATE (PF) 2 MG/ML IV SOLN: 1.0000 mg | INTRAVENOUS | Status: DC | PRN

## 2018-07-10 MED ORDER — 0.9 % SODIUM CHLORIDE (POUR BTL) OPTIME
TOPICAL | Status: DC | PRN
  Administered 2018-07-10 (×4): 1000 mL

## 2018-07-10 MED ORDER — MIDAZOLAM HCL 2 MG/2ML IJ SOLN
INTRAMUSCULAR | Status: AC
  Filled 2018-07-10: qty 2

## 2018-07-10 MED ORDER — LIDOCAINE-EPINEPHRINE 1 %-1:100000 IJ SOLN
INTRAMUSCULAR | Status: DC | PRN
  Administered 2018-07-10: 8 mL via INTRADERMAL

## 2018-07-10 MED ORDER — POTASSIUM CHLORIDE IN NACL 20-0.9 MEQ/L-% IV SOLN
INTRAVENOUS | Status: DC
  Administered 2018-07-10 – 2018-07-12 (×2): via INTRAVENOUS
  Filled 2018-07-10 (×5): qty 1000

## 2018-07-10 MED ORDER — CEFAZOLIN SODIUM-DEXTROSE 2-4 GM/100ML-% IV SOLN
2.0000 g | Freq: Three times a day (TID) | INTRAVENOUS | Status: AC
  Administered 2018-07-10 – 2018-07-11 (×2): 2 g via INTRAVENOUS
  Filled 2018-07-10 (×4): qty 100

## 2018-07-10 MED ORDER — BENAZEPRIL-HYDROCHLOROTHIAZIDE 20-25 MG PO TABS: 1.0000 | ORAL_TABLET | Freq: Every day | ORAL | Status: DC

## 2018-07-10 MED ORDER — MIDAZOLAM HCL 2 MG/2ML IJ SOLN
2.0000 mg | INTRAMUSCULAR | Status: DC | PRN
  Administered 2018-07-10: 14:00:00 2 mg via INTRAVENOUS
  Filled 2018-07-10: qty 2

## 2018-07-10 MED ORDER — MIDAZOLAM HCL 2 MG/2ML IJ SOLN
1.0000 mg | INTRAMUSCULAR | Status: DC | PRN
  Administered 2018-07-10 – 2018-07-11 (×5): 1 mg via INTRAVENOUS
  Filled 2018-07-10 (×5): qty 2

## 2018-07-10 MED ORDER — THROMBIN 20000 UNITS EX KIT
PACK | CUTANEOUS | Status: AC
  Filled 2018-07-10: qty 1

## 2018-07-10 MED ORDER — BENAZEPRIL HCL 20 MG PO TABS
20.0000 mg | ORAL_TABLET | Freq: Every day | ORAL | Status: DC
  Administered 2018-07-10 – 2018-07-13 (×4): 20 mg via ORAL
  Filled 2018-07-10 (×4): qty 1

## 2018-07-10 MED ORDER — THROMBIN (RECOMBINANT) 5000 UNITS EX SOLR
CUTANEOUS | Status: AC
  Filled 2018-07-10: qty 5000

## 2018-07-10 MED ORDER — ONDANSETRON HCL 4 MG/2ML IJ SOLN: 4.0000 mg | INTRAMUSCULAR | Status: DC | PRN

## 2018-07-10 MED ORDER — DEXMEDETOMIDINE HCL IN NACL 200 MCG/50ML IV SOLN: 0.0000 ug/kg/h | INTRAVENOUS | Status: DC

## 2018-07-10 MED ORDER — ONDANSETRON HCL 4 MG/2ML IJ SOLN
4.0000 mg | Freq: Once | INTRAMUSCULAR | Status: AC
  Administered 2018-07-10: 4 mg via INTRAVENOUS
  Filled 2018-07-10: qty 2

## 2018-07-10 MED ORDER — PROPOFOL 10 MG/ML IV BOLUS
INTRAVENOUS | Status: DC | PRN
  Administered 2018-07-10: 100 mg via INTRAVENOUS

## 2018-07-10 MED ORDER — ACETAMINOPHEN 325 MG PO TABS
650.0000 mg | ORAL_TABLET | ORAL | Status: DC | PRN
  Administered 2018-07-12: 17:00:00 650 mg via ORAL
  Filled 2018-07-10: qty 2

## 2018-07-10 MED ORDER — BACITRACIN ZINC 500 UNIT/GM EX OINT
TOPICAL_OINTMENT | CUTANEOUS | Status: DC | PRN
  Administered 2018-07-10: 1 via TOPICAL

## 2018-07-10 MED ORDER — ACETAMINOPHEN 650 MG RE SUPP: 650.0000 mg | RECTAL | Status: DC | PRN

## 2018-07-10 MED ORDER — PHENYLEPHRINE HCL (PRESSORS) 10 MG/ML IV SOLN
INTRAVENOUS | Status: DC | PRN
  Administered 2018-07-10: 80 ug via INTRAVENOUS

## 2018-07-10 MED ORDER — THROMBIN 20000 UNITS EX SOLR
CUTANEOUS | Status: DC | PRN
  Administered 2018-07-10: 20 mL via TOPICAL

## 2018-07-10 MED ORDER — INSULIN ASPART 100 UNIT/ML ~~LOC~~ SOLN
0.0000 [IU] | SUBCUTANEOUS | Status: DC
  Administered 2018-07-10: 2 [IU] via SUBCUTANEOUS
  Administered 2018-07-10: 18:00:00 3 [IU] via SUBCUTANEOUS
  Administered 2018-07-10: 23:00:00 2 [IU] via SUBCUTANEOUS

## 2018-07-10 MED ORDER — LABETALOL HCL 5 MG/ML IV SOLN
10.0000 mg | INTRAVENOUS | Status: DC | PRN
  Administered 2018-07-11 (×2): 20 mg via INTRAVENOUS
  Filled 2018-07-10 (×3): qty 4

## 2018-07-10 MED ORDER — PROPOFOL 1000 MG/100ML IV EMUL
5.0000 ug/kg/min | INTRAVENOUS | Status: DC
  Administered 2018-07-10 – 2018-07-11 (×3): 80 ug/kg/min via INTRAVENOUS
  Filled 2018-07-10 (×3): qty 100

## 2018-07-10 MED ORDER — METFORMIN HCL 850 MG PO TABS
850.0000 mg | ORAL_TABLET | Freq: Two times a day (BID) | ORAL | Status: DC
  Filled 2018-07-10 (×2): qty 1

## 2018-07-10 MED ORDER — POLYETHYLENE GLYCOL 3350 17 G PO PACK: 17.0000 g | PACK | Freq: Every day | ORAL | Status: DC | PRN

## 2018-07-10 MED ORDER — ROSUVASTATIN CALCIUM 5 MG PO TABS
10.0000 mg | ORAL_TABLET | Freq: Every day | ORAL | Status: DC
  Administered 2018-07-10: 18:00:00 10 mg via ORAL
  Filled 2018-07-10: qty 2

## 2018-07-10 MED ORDER — DEXAMETHASONE SODIUM PHOSPHATE 4 MG/ML IJ SOLN
INTRAMUSCULAR | Status: DC | PRN
  Administered 2018-07-10: 5 mg via INTRAVENOUS

## 2018-07-10 MED ORDER — CEFAZOLIN SODIUM-DEXTROSE 2-3 GM-%(50ML) IV SOLR
INTRAVENOUS | Status: DC | PRN
  Administered 2018-07-10: 2 g via INTRAVENOUS

## 2018-07-10 MED ORDER — SODIUM CHLORIDE 0.9 % IV SOLN
INTRAVENOUS | Status: DC | PRN
  Administered 2018-07-10: 10:00:00 via INTRAVENOUS

## 2018-07-10 MED ORDER — SODIUM CHLORIDE 0.9 % IV SOLN
INTRAVENOUS | Status: DC | PRN
  Administered 2018-07-10: 25 ug/min via INTRAVENOUS

## 2018-07-10 MED ORDER — PROPOFOL 1000 MG/100ML IV EMUL
INTRAVENOUS | Status: AC
  Administered 2018-07-10: 17:00:00
  Filled 2018-07-10: qty 100

## 2018-07-10 MED ORDER — HYDROCHLOROTHIAZIDE 25 MG PO TABS
25.0000 mg | ORAL_TABLET | Freq: Every day | ORAL | Status: DC
  Administered 2018-07-10 – 2018-07-13 (×4): 25 mg via ORAL
  Filled 2018-07-10 (×4): qty 1

## 2018-07-10 MED ORDER — PROPOFOL 10 MG/ML IV BOLUS
INTRAVENOUS | Status: AC
  Filled 2018-07-10: qty 20

## 2018-07-10 MED ORDER — MIDAZOLAM HCL 2 MG/2ML IJ SOLN
2.0000 mg | INTRAMUSCULAR | Status: AC | PRN
  Administered 2018-07-10 (×2): 2 mg via INTRAVENOUS
  Administered 2018-07-10: 10:00:00 1 mg via INTRAVENOUS
  Filled 2018-07-10 (×3): qty 2

## 2018-07-10 MED ORDER — SODIUM CHLORIDE 0.9 % IV SOLN
10.0000 mL/h | Freq: Once | INTRAVENOUS | Status: AC
  Administered 2018-07-10: 09:00:00 10 mL/h via INTRAVENOUS

## 2018-07-10 MED ORDER — DOCUSATE SODIUM 100 MG PO CAPS
100.0000 mg | ORAL_CAPSULE | Freq: Two times a day (BID) | ORAL | Status: DC
  Administered 2018-07-12 (×2): 100 mg via ORAL
  Filled 2018-07-10 (×2): qty 1

## 2018-07-10 MED ORDER — FLEET ENEMA 7-19 GM/118ML RE ENEM
1.0000 | ENEMA | Freq: Once | RECTAL | Status: DC | PRN
  Filled 2018-07-10: qty 1

## 2018-07-10 MED ORDER — ADULT MULTIVITAMIN W/MINERALS CH
1.0000 | ORAL_TABLET | Freq: Every day | ORAL | Status: DC
  Administered 2018-07-11 – 2018-07-13 (×3): 1 via ORAL
  Filled 2018-07-10 (×3): qty 1

## 2018-07-10 MED ORDER — HYDROCODONE-ACETAMINOPHEN 5-325 MG PO TABS: 1.0000 | ORAL_TABLET | ORAL | Status: DC | PRN

## 2018-07-10 SURGICAL SUPPLY — 78 items
BASKET BONE COLLECTION (BASKET) IMPLANT
BATTERY IQ STERILE (MISCELLANEOUS) ×1 IMPLANT
BIT DRILL WIRE PASS 1.3MM (BIT) IMPLANT
BNDG CMPR 75X41 PLY HI ABS (GAUZE/BANDAGES/DRESSINGS)
BNDG GAUZE ELAST 4 BULKY (GAUZE/BANDAGES/DRESSINGS) IMPLANT
BNDG STRETCH 4X75 STRL LF (GAUZE/BANDAGES/DRESSINGS) IMPLANT
BTRY SRG DRVR 1.5 IQ (MISCELLANEOUS) ×1
BUR ACORN 6.0 PRECISION (BURR) ×2 IMPLANT
BUR SPIRAL ROUTER 2.3 (BUR) IMPLANT
CANISTER SUCT 3000ML PPV (MISCELLANEOUS) ×2 IMPLANT
CARTRIDGE OIL MAESTRO DRILL (MISCELLANEOUS) ×1 IMPLANT
CATH ROBINSON RED A/P 12FR (CATHETERS) IMPLANT
CLIP VESOCCLUDE MED 6/CT (CLIP) IMPLANT
COVER WAND RF STERILE (DRAPES) ×2 IMPLANT
DECANTER SPIKE VIAL GLASS SM (MISCELLANEOUS) ×2 IMPLANT
DIFFUSER DRILL AIR PNEUMATIC (MISCELLANEOUS) ×2 IMPLANT
DRAIN PENROSE 1/2X12 LTX STRL (WOUND CARE) IMPLANT
DRAPE NEUROLOGICAL W/INCISE (DRAPES) ×2 IMPLANT
DRAPE WARM FLUID 44X44 (DRAPE) ×2 IMPLANT
DRILL WIRE PASS 1.3MM (BIT)
DRSG OPSITE 4X5.5 SM (GAUZE/BANDAGES/DRESSINGS) ×2 IMPLANT
DRSG PAD ABDOMINAL 8X10 ST (GAUZE/BANDAGES/DRESSINGS) IMPLANT
DRSG TELFA 3X8 NADH (GAUZE/BANDAGES/DRESSINGS) ×2 IMPLANT
DURAPREP 6ML APPLICATOR 50/CS (WOUND CARE) ×2 IMPLANT
ELECT REM PT RETURN 9FT ADLT (ELECTROSURGICAL) ×2
ELECTRODE REM PT RTRN 9FT ADLT (ELECTROSURGICAL) ×1 IMPLANT
EVACUATOR SILICONE 100CC (DRAIN) IMPLANT
GAUZE 4X4 16PLY RFD (DISPOSABLE) IMPLANT
GAUZE SPONGE 4X4 12PLY STRL (GAUZE/BANDAGES/DRESSINGS) IMPLANT
GLOVE BIO SURGEON STRL SZ7 (GLOVE) ×1 IMPLANT
GLOVE BIO SURGEON STRL SZ8 (GLOVE) ×2 IMPLANT
GLOVE BIOGEL PI IND STRL 7.5 (GLOVE) IMPLANT
GLOVE BIOGEL PI IND STRL 8 (GLOVE) ×1 IMPLANT
GLOVE BIOGEL PI IND STRL 8.5 (GLOVE) ×1 IMPLANT
GLOVE BIOGEL PI INDICATOR 7.5 (GLOVE) ×2
GLOVE BIOGEL PI INDICATOR 8 (GLOVE) ×1
GLOVE BIOGEL PI INDICATOR 8.5 (GLOVE) ×1
GLOVE ECLIPSE 7.0 STRL STRAW (GLOVE) ×2 IMPLANT
GLOVE ECLIPSE 8.0 STRL XLNG CF (GLOVE) ×2 IMPLANT
GLOVE EXAM NITRILE XL STR (GLOVE) IMPLANT
GOWN STRL REUS W/ TWL LRG LVL3 (GOWN DISPOSABLE) IMPLANT
GOWN STRL REUS W/ TWL XL LVL3 (GOWN DISPOSABLE) IMPLANT
GOWN STRL REUS W/TWL 2XL LVL3 (GOWN DISPOSABLE) IMPLANT
GOWN STRL REUS W/TWL LRG LVL3 (GOWN DISPOSABLE) ×8
GOWN STRL REUS W/TWL XL LVL3 (GOWN DISPOSABLE) ×2
GRAFT DURAGEN MATRIX 2WX2L ×1 IMPLANT
HEMOSTAT SURGICEL 2X14 (HEMOSTASIS) ×2 IMPLANT
KIT BASIN OR (CUSTOM PROCEDURE TRAY) ×2 IMPLANT
KIT TURNOVER KIT B (KITS) ×2 IMPLANT
NDL HYPO 25X1 1.5 SAFETY (NEEDLE) ×1 IMPLANT
NEEDLE HYPO 25X1 1.5 SAFETY (NEEDLE) ×2 IMPLANT
NS IRRIG 1000ML POUR BTL (IV SOLUTION) ×2 IMPLANT
OIL CARTRIDGE MAESTRO DRILL (MISCELLANEOUS) ×2
PACK CRANIOTOMY CUSTOM (CUSTOM PROCEDURE TRAY) ×2 IMPLANT
PAD ARMBOARD 7.5X6 YLW CONV (MISCELLANEOUS) ×2 IMPLANT
PAD DRESSING TELFA 3X8 NADH (GAUZE/BANDAGES/DRESSINGS) IMPLANT
PATTIES SURGICAL .5 X.5 (GAUZE/BANDAGES/DRESSINGS) IMPLANT
PATTIES SURGICAL .5 X3 (DISPOSABLE) IMPLANT
PATTIES SURGICAL 1X1 (DISPOSABLE) IMPLANT
PIN MAYFIELD SKULL DISP (PIN) IMPLANT
PLATE 1.5  2HOLE LNG NEURO (Plate) ×2 IMPLANT
PLATE 1.5 2HOLE LNG NEURO (Plate) IMPLANT
PLATE 1.5/0.5 13MM BURR HOLE (Plate) ×2 IMPLANT
SCREW SELF DRILL HT 1.5/5MM (Screw) ×12 IMPLANT
SPECIMEN JAR SMALL (MISCELLANEOUS) IMPLANT
SPONGE NEURO XRAY DETECT 1X3 (DISPOSABLE) IMPLANT
SPONGE SURGIFOAM ABS GEL 100 (HEMOSTASIS) IMPLANT
STAPLER SKIN PROX WIDE 3.9 (STAPLE) ×2 IMPLANT
SUT ETHILON 3 0 PS 1 (SUTURE) IMPLANT
SUT NURALON 4 0 TR CR/8 (SUTURE) ×3 IMPLANT
SUT VIC AB 2-0 CP2 18 (SUTURE) ×4 IMPLANT
SUT VIC AB 3-0 SH 8-18 (SUTURE) IMPLANT
SYR CONTROL 10ML LL (SYRINGE) IMPLANT
TOWEL GREEN STERILE (TOWEL DISPOSABLE) ×2 IMPLANT
TOWEL GREEN STERILE FF (TOWEL DISPOSABLE) ×2 IMPLANT
TRAY FOLEY MTR SLVR 16FR STAT (SET/KITS/TRAYS/PACK) IMPLANT
UNDERPAD 30X30 (UNDERPADS AND DIAPERS) IMPLANT
WATER STERILE IRR 1000ML POUR (IV SOLUTION) ×2 IMPLANT

## 2018-07-10 NOTE — Brief Op Note (Signed)
07/10/2018  11:40 AM  PATIENT:  Jennifer Richards  53 y.o. female  PRE-OPERATIVE DIAGNOSIS:  right subdural hematoma with coma and with fixed and dilated right pupil and coagulopathy (INR 6)  POST-OPERATIVE DIAGNOSIS:  right subdural hematoma with coma and with fixed and dilated right pupil and coagulopathy (INR 6)  PROCEDURE:  Procedure(s): CRANIOTOMY HEMATOMA EVACUATION SUBDURAL (Right)   SURGEON:  Surgeon(s) and Role:    Erline Levine, MD - Primary    * Consuella Lose, MD - Assisting  PHYSICIAN ASSISTANT:   ASSISTANTS: none   ANESTHESIA:   general  EBL:  100 mL   BLOOD ADMINISTERED:1 Unit FFP  DRAINS: (#10) Jackson-Pratt drain(s) with closed bulb suction in the subdural space   LOCAL MEDICATIONS USED:  MARCAINE    and LIDOCAINE   SPECIMEN:  No Specimen  DISPOSITION OF SPECIMEN:  N/A  COUNTS:  YES  TOURNIQUET:  * No tourniquets in log *  DICTATION: Patient is 53 year old woman found down with INR 6 and large right SDH with 1.2 cm right to left shift and blown right pupil.  She is on coumadin for DVT.  It was elected to take patient to surgery emergently for right craniotomy for SDH.  Procedure:  Following smooth intubation, patient was placed in left semi-lateral position with blanket roll.  Head was placed on donut head holder and right frontal scalp was shaved and prepped and draped in usual sterile fashion.  Area of planned incision was infiltrated with lidocaine. A curvilinear incision was made and carried through temporalis fascia and muscle to expose calvarium.  Skull flap was elevated exposing subdural hematoma.  Dura was opened and subdural was evacuated.  This was of mixed densities with acute clot and was under pressure.   This was evacuated and a considerable amount of blood was removed. The brain was irrigated.    Hemostasis was assured. The brain came back up after hematoma evacuation.  The dura was closed with 4-0 neurilon sutures after placing a #10 JP  drain, bone flap was replaced with plates, the fascia and galea were closed with 2-0 vicryl sutures and the skin was re approximated with staples.  A sterile occlusive dressing was placed.  Patient was kept intubated and was taken to the Neuro ICU in stable condition having tolerated her operation well.   PLAN OF CARE: Admit to inpatient   PATIENT DISPOSITION:  PACU - hemodynamically stable.   Delay start of Pharmacological VTE agent (>24hrs) due to surgical blood loss or risk of bleeding: yes

## 2018-07-10 NOTE — Anesthesia Postprocedure Evaluation (Signed)
Anesthesia Post Note  Patient: Jennifer Richards  Procedure(s) Performed: CRANIOTOMY HEMATOMA EVACUATION SUBDURAL (Right Head)     Patient location during evaluation: SICU Anesthesia Type: General Level of consciousness: sedated Pain management: pain level controlled Vital Signs Assessment: post-procedure vital signs reviewed and stable Respiratory status: patient remains intubated per anesthesia plan Cardiovascular status: stable Postop Assessment: no apparent nausea or vomiting Anesthetic complications: no    Last Vitals:  Vitals:   07/10/18 1255 07/10/18 1300  BP:    Pulse: (!) 58 (!) 57  Resp: 16 16  Temp:    SpO2: 100% 100%    Last Pain:  Vitals:   07/10/18 1230  TempSrc: Axillary                 Jennifer Richards S

## 2018-07-10 NOTE — Progress Notes (Signed)
Pt transported on vent from OR to 4N23.

## 2018-07-10 NOTE — Progress Notes (Signed)
Patient transported on vent from 4N23 to 6W31 without complications.

## 2018-07-10 NOTE — Transfer of Care (Signed)
Immediate Anesthesia Transfer of Care Note  Patient: Jennifer Richards  Procedure(s) Performed: CRANIOTOMY HEMATOMA EVACUATION SUBDURAL (Right Head)  Patient Location: ICU  Anesthesia Type:General  Level of Consciousness: sedated and Patient remains intubated per anesthesia plan  Airway & Oxygen Therapy: Patient remains intubated per anesthesia plan  Post-op Assessment: Report given to RN and Post -op Vital signs reviewed and stable  Post vital signs: Reviewed and stable  Last Vitals:  Vitals Value Taken Time  BP    Temp    Pulse 67 07/10/2018 12:22 PM  Resp 15 07/10/2018 12:22 PM  SpO2 100 % 07/10/2018 12:22 PM  Vitals shown include unvalidated device data.  Last Pain:  Vitals:   07/10/18 1200  TempSrc: Axillary         Complications: No apparent anesthesia complications   Report to RN at bedside.  Transported on ventilator.  VS remained stable for transport.  Nurse to get BMP and give second unit of FFP.  Respiratory therapy at bedside for ventilator management.

## 2018-07-10 NOTE — Anesthesia Preprocedure Evaluation (Addendum)
Anesthesia Evaluation  Patient identified by MRN, date of birth, ID band Patient unresponsive    Reviewed: Allergy & Precautions, NPO status , Patient's Chart, lab work & pertinent test resultsPreop documentation limited or incomplete due to emergent nature of procedure.  Airway Mallampati: Intubated  TM Distance: >3 FB Neck ROM: Full    Dental no notable dental hx.    Pulmonary neg pulmonary ROS,    Pulmonary exam normal breath sounds clear to auscultation       Cardiovascular hypertension, Pt. on medications Normal cardiovascular exam Rhythm:Regular Rate:Normal     Neuro/Psych Massive subdural hematoma negative psych ROS   GI/Hepatic negative GI ROS, Neg liver ROS,   Endo/Other  diabetes  Renal/GU negative Renal ROS  negative genitourinary   Musculoskeletal negative musculoskeletal ROS (+)   Abdominal   Peds negative pediatric ROS (+)  Hematology INR 6.0   Anesthesia Other Findings   Reproductive/Obstetrics negative OB ROS                             Anesthesia Physical Anesthesia Plan  ASA: V and emergent  Anesthesia Plan: General   Post-op Pain Management:    Induction: Inhalational  PONV Risk Score and Plan: Treatment may vary due to age or medical condition  Airway Management Planned: Oral ETT  Additional Equipment: Arterial line  Intra-op Plan:   Post-operative Plan: Post-operative intubation/ventilation  Informed Consent: I have reviewed the patients History and Physical, chart, labs and discussed the procedure including the risks, benefits and alternatives for the proposed anesthesia with the patient or authorized representative who has indicated his/her understanding and acceptance.     Dental advisory given  Plan Discussed with: CRNA and Surgeon  Anesthesia Plan Comments: (Patient extremely coagulopathic in setting of massive subdural hematoma. )        Anesthesia Quick Evaluation

## 2018-07-10 NOTE — Progress Notes (Signed)
Patient is much more awake postop.  Right pupil is still large (5 vs 3 on left), but is now reactive.  Patient is still not following commands, but is much more responsive.  Dressing CDI.

## 2018-07-10 NOTE — Progress Notes (Signed)
Pt result of positive coronavirus called to RN at 1550. ID and AC immediately called. Room converted to negative pressure room by facilities. Equipment not on unit at time, N95 masks and shields from 37M sent up. PCCM and Dr Vertell Limber both notified and initiated a transfer to 37M. Will continue to monitor pt until transfer approved.  Pt transferred at 1650 with RT, PCCM doc and 2 RNs.

## 2018-07-10 NOTE — ED Notes (Signed)
Patient transported to CT on the cardiac monitor.

## 2018-07-10 NOTE — ED Provider Notes (Signed)
Medical screening examination/treatment/procedure(s) were conducted as a shared visit with non-physician practitioner(s) and myself.  I personally evaluated the patient during the encounter.  On exam, pt is lethargic.  Does answer questions with prompting although minimally.  Able to squeeze my fingers and move feet.  Right pupil is dilated compared to left.  Sx concerning for acute CNS event, possible CNS hemorrhage.  Stat CT ordered.    Prelim review of the CT scan suggests acute subdural hematoma with midline shift.  Will contact neurosurgery.  Prelim INR is reported to be 6.  They are repeating.  Kcentra ordered per protocol. Appreciate assistance of  PharmD Rum[place]ger.   D/w Dr Vertell Limber.  Will reverse INR, plan on OR after reversal.  Will intubate in anticipation of OR and for airway protection. . Tolerated intubation.  Sedation ordered.  Remains stable.    Procedure Name: Intubation Date/Time: 07/10/2018 8:43 AM Performed by: Dorie Rank, MD Pre-anesthesia Checklist: Patient identified, Patient being monitored, Emergency Drugs available, Timeout performed and Suction available Oxygen Delivery Method: Non-rebreather mask Preoxygenation: Pre-oxygenation with 100% oxygen Induction Type: Rapid sequence Ventilation: Mask ventilation without difficulty Laryngoscope Size: Glidescope and 3 Grade View: Grade II Number of attempts: 1 Airway Equipment and Method: Video-laryngoscopy Placement Confirmation: ETT inserted through vocal cords under direct vision,  CO2 detector and Breath sounds checked- equal and bilateral Secured at: 25 cm Tube secured with: ETT holder Dental Injury: Teeth and Oropharynx as per pre-operative assessment     .Critical Care Performed by: Dorie Rank, MD Authorized by: Dorie Rank, MD   Critical care provider statement:    Critical care time (minutes):  45   Critical care was necessary to treat or prevent imminent or life-threatening deterioration of the following  conditions:  CNS failure or compromise   Critical care was time spent personally by me on the following activities:  Discussions with consultants, evaluation of patient's response to treatment, examination of patient, ordering and performing treatments and interventions, ordering and review of laboratory studies, ordering and review of radiographic studies, pulse oximetry, re-evaluation of patient's condition, obtaining history from patient or surrogate and review of old charts     Dorie Rank, MD 07/10/18 [phone]

## 2018-07-10 NOTE — ED Notes (Signed)
RT called for transport

## 2018-07-10 NOTE — ED Notes (Addendum)
Patient's husband at the bedside at this time. This RN filled out consent form however patient's family has not yet spoken to surgeon so consent form was not signed. Will be brought to OR with patient, OR notified.

## 2018-07-10 NOTE — Consult Note (Signed)
NAME:  Jennifer Richards, MRN:  [id number], DOB:  Oct 25, 1965, LOS: 0 ADMISSION DATE:  07/10/2018, CONSULTATION DATE:  07/10/2018 REFERRING MD:  Dr. Vertell Limber, CHIEF COMPLAINT:  Subdural hemorrhage   Brief History   53 year old right-handed woman, presented hospital via EMS with altered mental status, dilated right pupil, nonresponsive, found to have subdural hematoma.  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  EMS was called and responded.  History supplied by the husband indicates that patient had not felt well for 2 to 3 days prior; subjective fevers were reported, as well as a cough.  Because he works in a [employer] and had to go to work on 12-hour shifts, he was only checking on her intermittently.  Between seeing her in the evening of the 18th, and her reporting that she was not feeling well he returned to see her this morning, before going to work and noticed that she had vomited on herself, was poorly responsive, and had right pupil that was altered.  He called EMS.  EMS stated that on exam, patient's right eyes not opening, pupil splint on the right, as well asright-sided pronator drift.  CBG elevated, vital signs otherwise reassuring.    In the ED, patient was altered and unable to answer most questions.   Past Medical History  1) bradycardia s/p pacemaker,  2) hypertension  3) diabetes  Significant Hospital Events   07/10/2018: Craniotomy, evacuation of subdural hematoma  Consults:  PCCM  Procedures:  4/19 craniotomy for hematoma evac; aline placed in OR 4/19 ETT in ED  Significant Diagnostic Tests:  07/10/2018: Noncontrasted CT of the head: (prior to surgery)  " there is a subdural hematoma on the right with a maximum thickness of 10 mm, containing acute blood products. There also appears to be a small amount of subarachnoid  hemorrhage adjacent to the medial right frontal lobe. The right subdural hematoma results in right to left midline shift of 12 mm and significant partial effacement of the basal cisterns. There is also mass effect on the right lateral ventricle."  Micro Data:  n/a  Antimicrobials:  N/a   Interim history/subjective:  Patient presents to the floor, sedated.  She is still poorly responsive, but responds to name.  Right pupil reported to be 9 mm at time of original presentation, now responsive and approximately 5 mm  Objective   Blood pressure (!) 142/77, pulse (!) 57, temperature 97.9 F (36.6 C), temperature source Axillary, resp. rate 16, height 5\' 4"  (1.626 m), weight 59 kg, SpO2 100 %.    Vent Mode: PRVC FiO2 (%):  [50 %-100 %] 50 % Set Rate:  [15 bmp-16 bmp] 16 bmp Vt Set:  [430 mL-460 mL] 430 mL PEEP:  [5 cmH20] 5 cmH20 Plateau Pressure:  [15 cmH20-18 cmH20] 18 cmH20   Intake/Output Summary (Last 24 hours) at 07/10/2018 1355 Last data filed at 07/10/2018 1200 Gross per 24 hour  Intake 402.27 ml  Output 400 ml  Net 2.27 ml   Filed Weights   07/10/18 0746  Weight: 59 kg    Examination: General: NAD, well nourished HENT: right frontal dressing appropriate to crani, otherwise normocephalic, atruamatic Lungs: CTA Cardiovascular: RRR Abdomen: SND Extremities: no edema Neuro: Right pupil reported to be 9 mm at time of original presentation, now responsive and approximately 5 mm GU: foley in place  Resolved Hospital Problem list  none  Assessment & Plan:  Hypertension  Plan: PRN IV labetalol, hydralazine until extub. Then home meds  Diabetes PLAN: SSI, PRN CBG Hold home metformin for now  Mechanical ventilation PLAN: Wean as able, extubate when satisfactory parameters and can neurologically protect airway  Fever/cough PLAN: COVID screen/precautions until ruled out, monitor  Coagulopathy Likely related to supratherapeutic coumadin; now corrected to ~1.4 with  FFP; DVT prophylaxis with SCDs for now. Can consider heparin in agreement with NSU team.  Best practice:  Diet: NPO for now Pain/Anxiety/Delirium protocol (if indicated): fentanyl, precedex VAP protocol (if indicated): n/a at this time DVT prophylaxis: SCDs GI prophylaxis: HOB 30, protonix Glucose control: CBG, SSI Mobility: bed Code Status: full Family Communication: per NSU, family up to date with op, and post op situation at this time Disposition: neuro ICU  Labs   CBC: Recent Labs  Lab 07/10/18 0735  WBC 8.9  NEUTROABS 7.5  HGB 11.1*  HCT 35.9*  MCV 83.1  PLT 442*    Basic Metabolic Panel: Recent Labs  Lab 07/10/18 0735 07/10/18 0742  NA 136  --   K 3.5  --   CL 100  --   CO2 23  --   GLUCOSE 270*  --   BUN 11  --   CREATININE 0.78 0.70  CALCIUM 9.2  --    GFR: Estimated Creatinine Clearance: 70.2 mL/min (by C-G formula based on SCr of 0.7 mg/dL). Recent Labs  Lab 07/10/18 0735  WBC 8.9    Liver Function Tests: Recent Labs  Lab 07/10/18 0735  AST 18  ALT 36  ALKPHOS 111  BILITOT 0.4  PROT 6.9  ALBUMIN 2.9*   No results for input(s): LIPASE, AMYLASE in the last 168 hours. No results for input(s): AMMONIA in the last 168 hours.  ABG No results found for: PHART, PCO2ART, PO2ART, HCO3, TCO2, ACIDBASEDEF, O2SAT   Coagulation Profile: Recent Labs  Lab 07/10/18 0735 07/10/18 1026  INR 6.1* 1.5*    Cardiac Enzymes: No results for input(s): CKTOTAL, CKMB, CKMBINDEX, TROPONINI in the last 168 hours.  HbA1C: No results found for: HGBA1C  CBG: Recent Labs  Lab 07/10/18 0729  GLUCAP 248*    Review of Systems:   Unable to obtain secondary to pt mental status, intubation  Past Medical History  She,  has a past medical history of Diabetes mellitus without complication ([place]) and Hypertension.   Surgical History   Pacemaker placement    Social History   reports that she has never smoked. She has never used smokeless tobacco. She  reports current alcohol use. She reports that she does not use drugs.   Family History   Her family history is not on file.   Allergies No Known Allergies   Home Medications  Prior to Admission medications   Medication Sig Start Date End Date Taking? Authorizing Provider  benazepril-hydrochlorthiazide (LOTENSIN HCT) 20-25 MG tablet Take 1 tablet by mouth daily. 06/20/08  Yes [provider]  loratadine (CLARITIN) 10 MG tablet Take 10 mg by mouth daily. 06/20/09  Yes [provider]  metFORMIN (GLUCOPHAGE) 850 MG tablet Take 850 mg by mouth 2 (two) times daily. 06/20/08  Yes [provider]  Multiple Vitamin (MULTIVITAMIN WITH MINERALS) TABS tablet Take 1 tablet by mouth daily.   Yes [provider]  POTASSIUM CHLORIDE PO Take 1 tablet by mouth daily.   Yes [provider]  rosuvastatin (CRESTOR) 10 MG tablet Take 10 mg by mouth daily. 10/21/16  Yes  [provider]  warfarin (COUMADIN) 5 MG tablet Take 5 mg by mouth daily at 6 PM.    Yes [provider]     Critical care time: : I have independently seen and examined the patient, reviewed data, and developed an assessment and plan. A total of 44 minutes were spent in critical care assessment and medical decision making. This critical care time does not reflect procedure time, or teaching time or supervisory time of PA/NP/Med student/Med Resident, etc but could involve care discussion time. Agree with the documentation above.   Bonna Gains, MD PhD

## 2018-07-10 NOTE — Op Note (Signed)
07/10/2018  11:40 AM  PATIENT:  Jennifer Richards  53 y.o. female  PRE-OPERATIVE DIAGNOSIS:  right subdural hematoma with coma and with fixed and dilated right pupil and coagulopathy (INR 6)  POST-OPERATIVE DIAGNOSIS:  right subdural hematoma with coma and with fixed and dilated right pupil and coagulopathy (INR 6)  PROCEDURE:  Procedure(s): CRANIOTOMY HEMATOMA EVACUATION SUBDURAL (Right)   SURGEON:  Surgeon(s) and Role:    Erline Levine, MD - Primary    * Consuella Lose, MD - Assisting  PHYSICIAN ASSISTANT:   ASSISTANTS: none   ANESTHESIA:   general  EBL:  100 mL   BLOOD ADMINISTERED:1 Unit FFP  DRAINS: (#10) Jackson-Pratt drain(s) with closed bulb suction in the subdural space   LOCAL MEDICATIONS USED:  MARCAINE    and LIDOCAINE   SPECIMEN:  No Specimen  DISPOSITION OF SPECIMEN:  N/A  COUNTS:  YES  TOURNIQUET:  * No tourniquets in log *  DICTATION: Patient is 53 year old woman found down with INR 6 and large right SDH with 1.2 cm right to left shift and blown right pupil.  She is on coumadin for DVT.  It was elected to take patient to surgery emergently for right craniotomy for SDH.  Procedure:  Following smooth intubation, patient was placed in left semi-lateral position with blanket roll.  Head was placed on donut head holder and right frontal scalp was shaved and prepped and draped in usual sterile fashion.  Area of planned incision was infiltrated with lidocaine. A curvilinear incision was made and carried through temporalis fascia and muscle to expose calvarium.  Skull flap was elevated exposing subdural hematoma.  Dura was opened and subdural was evacuated.  This was of mixed densities with acute clot and was under pressure.   This was evacuated and a considerable amount of blood was removed. The brain was irrigated.    Hemostasis was assured. The brain came back up after hematoma evacuation.  The dura was closed with 4-0 neurilon sutures after placing a #10 JP  drain, bone flap was replaced with plates, the fascia and galea were closed with 2-0 vicryl sutures and the skin was re approximated with staples.  A sterile occlusive dressing was placed.  Patient was kept intubated and was taken to the Neuro ICU in stable condition having tolerated her operation well.   PLAN OF CARE: Admit to inpatient   PATIENT DISPOSITION:  PACU - hemodynamically stable.   Delay start of Pharmacological VTE agent (>24hrs) due to surgical blood loss or risk of bleeding: yes

## 2018-07-10 NOTE — ED Notes (Signed)
Consulted with pharmacy, pt to received full dose of K Centra prior to this RN hanging Vitamin K. Will recheck INR 30 mins after completion of Brut[place].

## 2018-07-10 NOTE — ED Notes (Signed)
Patient's husband Corie Allis can be reached at:  HOME: [phone] CELL: [phone]

## 2018-07-10 NOTE — ED Triage Notes (Signed)
Pt via EMS for altered mental status, lethargy, and stroke like sx. LKW 6 AM yesterday morning. EMS initially called for lethargy, cough, and low grade fever with patient's husband reporting temperature of 99 degrees. Pt with one episode of emesis this AM. On assessment, patient's R pupil dilated and nonreactive. Patient A&Ox4 and ambulatory at baseline. Patient alert only to self at this time. Arousable to loud speech. Respirations even and unlabored. Afebrile at this time. 124/74, O2 96-98%. Dr. Tomi Bamberger by the bedside. CBG 248

## 2018-07-10 NOTE — ED Notes (Addendum)
This RN spoke with patient's husband Samar Venneman over the phone. Per Mr. Gasner, patient LSW was 2 days ago on Fri morning. Mr. Fariss reports when he woke up this morning he noticed patient had thrown up on herself and fallen back asleep, when he went to help her to the restroom to clean up she was unable to walk. Patient has hx of HTN, DM type 2, and takes warfarin for "history of clots, could be one of the ones in lungs" but patient's husband was not sure. Per Mr. Prindle, patient has not been in any contact with COVID suspected or positive individuals but was having a fever and bad headache for about 4 days. Per patient's husband, patient has been afebrile for the past 2-3 days but "when she had a fever it was about 99 degrees". PCP is Kevan Ny. NKDA per patient's husband. Spoke with both Dr. Tomi Bamberger and Cortni, [place]. Per EDP, pt is not a COVID rule out patient, no precautions initiated other than universal masking and face shield. Patient is wearing mask at this time. Husband denies recent falls or trauma

## 2018-07-10 NOTE — ED Notes (Signed)
EDP aware of critical lab result.

## 2018-07-10 NOTE — H&P (Signed)
Reason for Consult:subdural hematoma Referring Physician: [name] is an 53 y.o. female.  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  History obtained by EMS and patient's husband.  EMS states on exam, patient's right eyes not opening, pupil splint on the right, as right-sided pronator drift.  States he required out because patient had lethargy.  CBG elevated, vital signs otherwise reassuring.  Patient altered and unable to answer most questions.  7:31 AM Discussed case with patient's husband, Jennifer Richards, who states that patient has seemed lethargic and sleepy for the last couple days.  States he became concerned today because he went to check on her and noted that she had vomited on herself and was coughing.  He thinks she had not realized vomited on herself and she seemed difficult to arouse. He denies any known falls or trauma.    No past medical history on file.  There are no active problems to display for this patient.  Past Medical History:  Diagnosis Date  . Diabetes mellitus without complication ([place])   . Hypertension       No family history on file.  Social History:  reports that she has never smoked. She has never used smokeless tobacco. She reports current alcohol use. She reports that she does not use drugs.  Allergies: No Known Allergies  Medications: I have reviewed the patient's current medications.  Results for orders placed or performed during the hospital encounter of 07/10/18 (from the past 48 hour(s))  POC CBG, ED     Status: Abnormal   Collection Time: 07/10/18  7:29 AM  Result Value Ref Range   Glucose-Capillary 248 (H) 70 - 99 mg/dL  Ethanol     Status: None   Collection Time: 07/10/18  7:35 AM  Result Value Ref Range   Alcohol, Ethyl (B) <10 <10 mg/dL    Comment: (NOTE) Lowest  detectable limit for serum alcohol is 10 mg/dL. For medical purposes only. Performed at [facility], [place] [address], [place], [place] [address]   Protime-INR     Status: Abnormal   Collection Time: 07/10/18  7:35 AM  Result Value Ref Range   Prothrombin Time 53.1 (H) 11.4 - 15.2 seconds   INR 6.1 (HH) 0.8 - 1.2    Comment: REPEATED TO VERIFY CRITICAL RESULT CALLED TO, READ BACK BY AND VERIFIED WITH: K.BROWN,RN @ [phone] 07/10/2018 [place] (NOTE) INR goal varies based on device and disease states. Performed at [facility], [place] [address] [place] [address] [place], [place] [address]   APTT     Status: Abnormal   Collection Time: 07/10/18  7:35 AM  Result Value Ref Range   aPTT 65 (H) 24 - 36 seconds    Comment:        IF BASELINE aPTT IS ELEVATED, SUGGEST PATIENT RISK ASSESSMENT BE USED TO DETERMINE APPROPRIATE ANTICOAGULANT THERAPY. Performed at [facility], [place] [address], [place], [place] [address]   CBC     Status: Abnormal   Collection Time: 07/10/18  7:35 AM  Result Value Ref Range   WBC 8.9 4.0 - 10.5 K/uL   RBC 4.32 3.87 - 5.11 MIL/uL   Hemoglobin 11.1 (L) 12.0 - 15.0 g/dL   HCT 35.9 (L) 36.0 - 46.0 %   MCV 83.1 80.0 - 100.0 fL   MCH 25.7 (L) 26.0 - 34.0 pg  MCHC 30.9 30.0 - 36.0 g/dL   RDW 12.6 11.5 - 15.5 %   Platelets 442 (H) 150 - 400 K/uL   nRBC 0.0 0.0 - 0.2 %    Comment: Performed at [facility], [place] [address], [place], [place] [address]  Differential     Status: None   Collection Time: 07/10/18  7:35 AM  Result Value Ref Range   Neutrophils Relative % 84 %   Neutro Abs 7.5 1.7 - 7.7 K/uL   Lymphocytes Relative 10 %   Lymphs Abs 0.8 0.7 - 4.0 K/uL   Monocytes Relative 5 %   Monocytes Absolute 0.5 0.1 - 1.0 K/uL   Eosinophils Relative 0 %   Eosinophils Absolute 0.0 0.0 - 0.5 K/uL   Basophils Relative 0 %   Basophils Absolute 0.0 0.0 - 0.1 K/uL   Immature Granulocytes 1 %   Abs Immature Granulocytes 0.04 0.00 - 0.07 K/uL    Comment:  Performed at [facility], [place] [address] [place], [place] [address]  Comprehensive metabolic panel     Status: Abnormal   Collection Time: 07/10/18  7:35 AM  Result Value Ref Range   Sodium 136 135 - 145 mmol/L   Potassium 3.5 3.5 - 5.1 mmol/L   Chloride 100 98 - 111 mmol/L   CO2 23 22 - 32 mmol/L   Glucose, Bld 270 (H) 70 - 99 mg/dL   BUN 11 6 - 20 mg/dL   Creatinine, Ser 0.78 0.44 - 1.00 mg/dL   Calcium 9.2 8.9 - 10.3 mg/dL   Total Protein 6.9 6.5 - 8.1 g/dL   Albumin 2.9 (L) 3.5 - 5.0 g/dL   AST 18 15 - 41 U/L   ALT 36 0 - 44 U/L   Alkaline Phosphatase 111 38 - 126 U/L   Total Bilirubin 0.4 0.3 - 1.2 mg/dL   GFR calc non Af Amer >60 >60 mL/min   GFR calc Af Amer >60 >60 mL/min   Anion gap 13 5 - 15    Comment: Performed at [facility], [place] [address] [place],  [address]  I-Stat beta hCG blood, ED     Status: None   Collection Time: 07/10/18  7:40 AM  Result Value Ref Range   I-stat hCG, quantitative <5.0 <5 mIU/mL   Comment 3            Comment:   GEST. AGE      CONC.  (mIU/mL)   <=1 WEEK        5 - 50     2 WEEKS       50 - 500     3 WEEKS       100 - 10,000     4 WEEKS     1,000 - 30,000        FEMALE AND NON-PREGNANT FEMALE:     LESS THAN 5 mIU/mL   I-stat Creatinine, ED     Status: None   Collection Time: 07/10/18  7:42 AM  Result Value Ref Range   Creatinine, Ser 0.70 0.44 - 1.00 mg/dL  Type and screen [place]     Status: None (Preliminary result)   Collection Time: 07/10/18  8:08 AM  Result Value Ref Range   ABO/RH(D) A NEG    Antibody Screen NEG    Sample Expiration 07/13/2018    Unit Number [id number]    Blood Component Type RED CELLS,LR    Unit division  00    Status of Unit ISSUED    Transfusion Status OK TO TRANSFUSE    Crossmatch Result      Compatible Performed at [facility], [place] [address], [place], [place] [address]    Unit Number [id number]    Blood Component Type RED CELLS,LR    Unit  division 00    Status of Unit ISSUED    Transfusion Status OK TO TRANSFUSE    Crossmatch Result Compatible   ABO/Rh     Status: None (Preliminary result)   Collection Time: 07/10/18  8:08 AM  Result Value Ref Range   ABO/RH(D)      A NEG Performed at [facility] [address] [place], [place] [address]   Prepare fresh frozen plasma     Status: None (Preliminary result)   Collection Time: 07/10/18  8:49 AM  Result Value Ref Range   Unit Number [id number]    Blood Component Type THAWED PLASMA    Unit division 00    Status of Unit ISSUED    Transfusion Status      OK TO TRANSFUSE Performed at [facility] [address], [place], [place] [address]    Unit Number [id number]    Blood Component Type THAWED PLASMA    Unit division 00    Status of Unit ISSUED    Transfusion Status OK TO TRANSFUSE   Prepare fresh frozen plasma     Status: None (Preliminary result)   Collection Time: 07/10/18  9:09 AM  Result Value Ref Range   Unit Number [id number]    Blood Component Type THAWED PLASMA    Unit division 00    Status of Unit ISSUED    Transfusion Status      OK TO TRANSFUSE Performed at [facility], [place] [address], [place], [place] [address]     Ct Head Wo Contrast  Result Date: 07/10/2018 CLINICAL DATA:  Altered mental status. Lethargy and vomiting. Unknown if patient had recent injury. EXAM: CT HEAD WITHOUT CONTRAST TECHNIQUE: Contiguous axial images were obtained from the base of the skull through the vertex without intravenous contrast. COMPARISON:  None. FINDINGS: Brain: There is a subdural hematoma on the right, adjacent to the frontal, parietal, and temporal lobes. Acute blood products are seen within the subdural hematoma. The maximum dimension of the subdural hematoma is 10 mm. There is also subdural blood adjacent to the falx and along the right side of the tentorium. There appears to be a small amount of subarachnoid hemorrhage in the region  of the medial left frontal lobe as seen on series 3, image 10. No other hemorrhage noted. The right-sided subdural hematoma results in right to left midline shift of 12 mm as measured on axial image 12. The right lateral ventricle is smaller than the left due to mass effect. There is significant partial effacement of the basal cisterns. The brainstem and cerebellum are normal. No acute cortical ischemia or infarct identified. Vascular: No hyperdense vessel or unexpected calcification. Skull: Normal. Negative for fracture or focal lesion. Sinuses/Orbits: There is partial opacification of the left frontal sinus and scattered ethmoid air cells. Remainder of the paranasal sinuses, mastoid air cells, and middle ears are normal. Other: None. IMPRESSION: 1. There is a subdural hematoma on the right with a maximum thickness of 10 mm, containing acute blood products. There also appears to be a small amount of subarachnoid hemorrhage adjacent to the medial right frontal lobe. The  right subdural hematoma results in right to left midline shift of 12 mm and significant partial effacement of the basal cisterns. There is also mass effect on the right lateral ventricle. 2. Mild sinus disease as above. Findings called to [employer], the patient's PA. Electronically Signed   By: Dorise Bullion III M.D   On: 07/10/2018 08:16   Dg Chest Portable 1 View  Result Date: 07/10/2018 CLINICAL DATA:  53 year old female status post intubation, subdural hematoma EXAM: PORTABLE CHEST 1 VIEW COMPARISON:  None. FINDINGS: The patient has been intubated. The tip of the endotracheal tube is 1.3 cm above the carina. A left subclavian approach cardiac rhythm maintenance device is in position. Leads project over the right heart and right ventricle. Low inspiratory volumes with mild perihilar atelectasis. No focal airspace consolidation, pulmonary edema, pleural effusion or pneumothorax. IMPRESSION: 1. The tip of the endotracheal tube is 1.3 cm  above the carina. 2. Low inspiratory volumes with bilateral perihilar atelectasis. Otherwise, no acute cardiopulmonary process. Electronically Signed   By: Jacqulynn Cadet M.D.   On: 07/10/2018 09:08    Review of Systems - Negative except As per HPI    Blood pressure 132/77, pulse 77, temperature 98.3 F (36.8 C), temperature source Oral, resp. rate 15, height 5\' 4"  (1.626 m), weight 59 kg, SpO2 100 %. Physical Exam  Constitutional: She appears well-developed and well-nourished.  HENT:  Head: Normocephalic and atraumatic.  Eyes: Right pupil is not round and not reactive. Left pupil is round and reactive. Pupils are unequal.  Neurological:  Patient sedated and paralyzed for intubation.  Per ER, progressive somnolence and unresponsiveness.  Left sided drift with fixed and dilated right pupil.    Assessment/Plan: Patient has large SDH on the right with significant right to left shift.  Blown right pupil.  INR 6.  K Centra given to correct INR.  To OR for emergent craniotomy for SDH.  FFP to be given during case, per Anesthesia.  Peggyann Shoals, MD 07/10/2018, 9:16 AM

## 2018-07-10 NOTE — Progress Notes (Signed)
I spoke with patient's husband.  She has been coughing for last few days with low grade fever.  She went to bed yesterday morning, not feeling well.  She stayed in bed all day yesterday.  Her husband was at work.  He called to check on her repeatedly.  He says she answered, but was irritable.  Yesterday evening, she also wasn't feeling well.  He let her rest.  This morning, he discovered she wasn't making any sense and had vomited and was not moving her left side well.  He called 911.  He saw that she couldn't open her right eye and that her right pupil was dilated.    He gives a history of blood clots to the lungs two years ago, for which she has been on coumadin.  He is not aware of any head trauma.

## 2018-07-10 NOTE — ED Notes (Addendum)
Per neurosurgery, patient to be brought to OR. OR is ready at this time.

## 2018-07-10 NOTE — Progress Notes (Signed)
Assisted tele visit to patient with husband Currie Paris RN

## 2018-07-10 NOTE — ED Notes (Signed)
Full dose K Centra given per instructions on bag.

## 2018-07-10 NOTE — ED Provider Notes (Signed)
[facility] EMERGENCY DEPARTMENT Provider Note   CSN: [id number] Arrival date & time: 07/10/18  7672    History   Chief Complaint Chief Complaint  Patient presents with   Altered Mental Status   Fatigue    HPI VIOLANDA BOBECK is a 53 y.o. female.     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  Level 5 caveat given patient's AMS, history obtained by EMS and patient's husband.  EMS states on exam, patient's right eyes not opening, pupil splint on the right, as right-sided pronator drift.  States he required out because patient had lethargy.  CBG elevated, vital signs otherwise reassuring.  Patient altered and unable to answer most questions.  7:31 AM Discussed case with patient's husband, Mliss Fritz, who states that patient has seemed lethargic and sleepy for the last couple days.  States he became concerned today because he went to check on her and noted that she had vomited on herself and was coughing.  He thinks she had not realized vomited on herself and she seemed difficult to arouse. He denies any known falls or trauma.    Past Medical History:  Diagnosis Date   Diabetes mellitus without complication ([place])    Hypertension     Patient Active Problem List   Diagnosis Date Noted   Subdural hematoma ([place]) 07/10/2018   OB History   No obstetric history on file.     Home Medications    Prior to Admission medications   Not on File    Family History No family history on file.  Social History Social History   Tobacco Use   Smoking status: Never Smoker   Smokeless tobacco: Never Used  Substance Use Topics   Alcohol use: Yes    Comment: occasional   Drug use: Never     Allergies   Patient has no known allergies.   Review of Systems Review of Systems  Respiratory: Positive for  cough.   Gastrointestinal: Positive for vomiting.  Neurological:       AMS, somnolent     Physical Exam Updated Vital Signs BP 128/68    Pulse 60    Temp 98.3 F (36.8 C) (Oral)    Resp 15    Ht 5\' 4"  (1.626 m)    Wt 59 kg    LMP  (LMP Unknown)    SpO2 100%    BMI 22.31 kg/m   Physical Exam Constitutional:      Appearance: She is obese.     Comments: somnolent  HENT:     Head: Normocephalic and atraumatic.     Mouth/Throat:     Mouth: Mucous membranes are dry.  Eyes:     Comments: Unilateral mydriasis (right). Right pupil non-reactive. Left pupil reactive. Right sided ptosis.  Cardiovascular:     Rate and Rhythm: Normal rate.     Pulses: Normal pulses.     Heart sounds: Normal heart sounds. No murmur.  Pulmonary:     Effort: Pulmonary effort is normal. No respiratory distress.     Breath sounds: Normal breath sounds. No wheezing, rhonchi or rales.  Abdominal:     General: Bowel sounds are normal.     Palpations: Abdomen is soft.     Tenderness: There is no abdominal tenderness.  Musculoskeletal:     Right lower leg: No edema.  Skin:  General: Skin is warm and dry.  Neurological:     Comments: Somnolent. Oriented to self. Able to state birthday. Unable to state year or place. Able to answer some questions but speech is slow. Right eye ptosis. Right pupil mydriatic and nonreactive. Grip strength decreased on right. Strength to BLE appears grossly symmetric. Reports symmetric sensation to BUE/BLE. Right pronator drift.     ED Treatments / Results  Labs (all labs ordered are listed, but only abnormal results are displayed) Labs Reviewed  PROTIME-INR - Abnormal; Notable for the following components:      Result Value   Prothrombin Time 53.1 (*)    INR 6.1 (*)    All other components within normal limits  APTT - Abnormal; Notable for the following components:   aPTT 65 (*)    All other components within normal limits  CBC - Abnormal; Notable for the following  components:   Hemoglobin 11.1 (*)    HCT 35.9 (*)    MCH 25.7 (*)    Platelets 442 (*)    All other components within normal limits  COMPREHENSIVE METABOLIC PANEL - Abnormal; Notable for the following components:   Glucose, Bld 270 (*)    Albumin 2.9 (*)    All other components within normal limits  CBG MONITORING, ED - Abnormal; Notable for the following components:   Glucose-Capillary 248 (*)    All other components within normal limits  ETHANOL  DIFFERENTIAL  RAPID URINE DRUG SCREEN, HOSP PERFORMED  URINALYSIS, ROUTINE W REFLEX MICROSCOPIC  PROTIME-INR  I-STAT CREATININE, ED  I-STAT BETA HCG BLOOD, ED ([facility], [facility], [facility] ONLY)  TYPE AND SCREEN  PREPARE FRESH FROZEN PLASMA  ABO/RH  PREPARE FRESH FROZEN PLASMA    EKG EKG Interpretation  Date/Time:  Sunday July 10 2018 07:30:33 EDT Ventricular Rate:  77 PR Interval:    QRS Duration: 109 QT Interval:  390 QTC Calculation: 442 R Axis:   -4 Text Interpretation:  Sinus rhythm Low voltage, precordial leads Borderline T abnormalities, anterior leads No old tracing to compare Confirmed by Dorie Rank [phone]) on 07/10/2018 7:35:46 AM   Radiology Ct Head Wo Contrast  Result Date: 07/10/2018 CLINICAL DATA:  Altered mental status. Lethargy and vomiting. Unknown if patient had recent injury. EXAM: CT HEAD WITHOUT CONTRAST TECHNIQUE: Contiguous axial images were obtained from the base of the skull through the vertex without intravenous contrast. COMPARISON:  None. FINDINGS: Brain: There is a subdural hematoma on the right, adjacent to the frontal, parietal, and temporal lobes. Acute blood products are seen within the subdural hematoma. The maximum dimension of the subdural hematoma is 10 mm. There is also subdural blood adjacent to the falx and along the right side of the tentorium. There appears to be a small amount of subarachnoid hemorrhage in the region of the medial left frontal lobe as seen on series 3, image 10. No other hemorrhage noted.  The right-sided subdural hematoma results in right to left midline shift of 12 mm as measured on axial image 12. The right lateral ventricle is smaller than the left due to mass effect. There is significant partial effacement of the basal cisterns. The brainstem and cerebellum are normal. No acute cortical ischemia or infarct identified. Vascular: No hyperdense vessel or unexpected calcification. Skull: Normal. Negative for fracture or focal lesion. Sinuses/Orbits: There is partial opacification of the left frontal sinus and scattered ethmoid air cells. Remainder of the paranasal sinuses, mastoid air cells, and middle ears are normal. Other: None. IMPRESSION: 1.  There is a subdural hematoma on the right with a maximum thickness of 10 mm, containing acute blood products. There also appears to be a small amount of subarachnoid hemorrhage adjacent to the medial right frontal lobe. The right subdural hematoma results in right to left midline shift of 12 mm and significant partial effacement of the basal cisterns. There is also mass effect on the right lateral ventricle. 2. Mild sinus disease as above. Findings called to [employer], the patient's PA. Electronically Signed   By: Dorise Bullion III M.D   On: 07/10/2018 08:16   Dg Chest Portable 1 View  Result Date: 07/10/2018 CLINICAL DATA:  53 year old female status post intubation, subdural hematoma EXAM: PORTABLE CHEST 1 VIEW COMPARISON:  None. FINDINGS: The patient has been intubated. The tip of the endotracheal tube is 1.3 cm above the carina. A left subclavian approach cardiac rhythm maintenance device is in position. Leads project over the right heart and right ventricle. Low inspiratory volumes with mild perihilar atelectasis. No focal airspace consolidation, pulmonary edema, pleural effusion or pneumothorax. IMPRESSION: 1. The tip of the endotracheal tube is 1.3 cm above the carina. 2. Low inspiratory volumes with bilateral perihilar atelectasis.  Otherwise, no acute cardiopulmonary process. Electronically Signed   By: Jacqulynn Cadet M.D.   On: 07/10/2018 09:08    Procedures Procedures (including critical care time) CRITICAL CARE Performed by: Rodney Booze   Total critical care time: 38 minutes  Critical care time was exclusive of separately billable procedures and treating other patients.  Critical care was necessary to treat or prevent imminent or life-threatening deterioration.  Critical care was time spent personally by me on the following activities: development of treatment plan with patient and/or surrogate as well as nursing, discussions with consultants, evaluation of patient's response to treatment, examination of patient, obtaining history from patient or surrogate, ordering and performing treatments and interventions, ordering and review of laboratory studies, ordering and review of radiographic studies, pulse oximetry and re-evaluation of patient's condition.   Medications Ordered in ED Medications  phytonadione (VITAMIN K) 10 mg in dextrose 5 % 50 mL IVPB (10 mg Intravenous New Bag/Given 07/10/18 0939)  fentaNYL (SUBLIMAZE) injection 100 mcg (100 mcg Intravenous Given 07/10/18 0903)  fentaNYL (SUBLIMAZE) injection 100 mcg (has no administration in time range)  midazolam (VERSED) injection 2 mg (2 mg Intravenous Given 07/10/18 0903)  midazolam (VERSED) injection 2 mg (has no administration in time range)  prothrombin complex conc human (KCENTRA) IVPB 1,697 Units (0 Units Intravenous Stopped 07/10/18 0934)  etomidate (AMIDATE) injection (20 mg Intravenous Given 07/10/18 0835)  succinylcholine (ANECTINE) injection (100 mg Intravenous Given 07/10/18 0836)  0.9 %  sodium chloride infusion (10 mL/hr Intravenous New Bag/Given 07/10/18 0910)  ondansetron (ZOFRAN) injection 4 mg (4 mg Intravenous Given 07/10/18 0857)     Initial Impression / Assessment and Plan / ED Course  I have reviewed the triage vital signs and the  nursing notes.  Pertinent labs & imaging results that were available during my care of the patient were reviewed by me and considered in my medical decision making (see chart for details).     Final Clinical Impressions(s) / ED Diagnoses   Final diagnoses:  Subdural hematoma ([place])  Subarachnoid hemorrhage ([place])   Pt presenting with AMS. Last known well approximately 48-72 hours ago per husband.   Pt somnolent but will answer questions. Oriented only to self. Right sided ptosis. Right pupil fixed and dilated. Right arm weakness with +pronator drift.  Stroke w/u ordered. Pt does not meet criteria for stroke code based on last known well.  7:42 AM Contacted CT tech to take pt for imaging next.   Personally reviewed CT which shows subdural with midline shift and ventricular effacement. Neurosurg consult placed. Official read also demonstrates small SAH.   8:13 AM Contacted lab tech who states initial INR is 6.1. They are repeating to verify. Kcentra ordered and pharmacy made aware.   Neurosurg consulted, Dr. Vertell Limber will take to OR after INR reversed. Pt admitted to neurosurgery service. Recommends intubation and INR reversal.  Pt intubated by Dr. Tomi Bamberger, post intubation CXR with good placement of ET tube. Pts BP post intubation has remained at 604 systolic or less.   Remainder of work up notable for mild anemia, elevated glucose w/o lab evidence of dka. EKG with NSR, borderline t wave abnormalities inferiorly.   ED Discharge Orders    None       Bishop Dublin 07/10/18 5409    Dorie Rank, MD 07/10/18 [phone]

## 2018-07-10 NOTE — Anesthesia Procedure Notes (Signed)
Arterial Line Insertion Start/End4/19/2020 9:55 AM, 07/10/2018 10:05 AM Performed by: Leim Fabry T, CRNA, CRNA  Preanesthetic checklist: patient identified, IV checked, site marked, risks and benefits discussed, surgical consent, monitors and equipment checked, pre-op evaluation, timeout performed and anesthesia consent radial was placed Catheter size: 20 G  Attempts: 1 Procedure performed without using ultrasound guided technique. Following insertion, Biopatch and dressing applied. Post procedure assessment: normal  Patient tolerated the procedure well with no immediate complications.

## 2018-07-11 ENCOUNTER — Encounter (HOSPITAL_COMMUNITY): Payer: Self-pay | Admitting: Neurosurgery

## 2018-07-11 ENCOUNTER — Inpatient Hospital Stay (HOSPITAL_COMMUNITY): Payer: BC Managed Care – PPO

## 2018-07-11 DIAGNOSIS — S065X9A Traumatic subdural hemorrhage with loss of consciousness of unspecified duration, initial encounter: Secondary | ICD-10-CM

## 2018-07-11 DIAGNOSIS — J988 Other specified respiratory disorders: Secondary | ICD-10-CM

## 2018-07-11 LAB — BPAM RBC
Blood Product Expiration Date: 202005012359
Blood Product Expiration Date: 202005022359
ISSUE DATE / TIME: 202004190906
ISSUE DATE / TIME: 202004190906
Unit Type and Rh: 600
Unit Type and Rh: 600

## 2018-07-11 LAB — BPAM FFP
Blood Product Expiration Date: 202004232359
Blood Product Expiration Date: 202004242359
ISSUE DATE / TIME: 202004190904
ISSUE DATE / TIME: 202004190904
Unit Type and Rh: 6200
Unit Type and Rh: 6200

## 2018-07-11 LAB — GLUCOSE, CAPILLARY
Glucose-Capillary: 107 mg/dL — ABNORMAL HIGH (ref 70–99)
Glucose-Capillary: 111 mg/dL — ABNORMAL HIGH (ref 70–99)
Glucose-Capillary: 101 mg/dL — ABNORMAL HIGH (ref 70–99)
Glucose-Capillary: 66 mg/dL — ABNORMAL LOW (ref 70–99)
Glucose-Capillary: 96 mg/dL (ref 70–99)
Glucose-Capillary: 92 mg/dL (ref 70–99)
Glucose-Capillary: 87 mg/dL (ref 70–99)

## 2018-07-11 LAB — TYPE AND SCREEN
ABO/RH(D): A NEG
Antibody Screen: NEGATIVE
Unit division: 0
Unit division: 0

## 2018-07-11 LAB — PREPARE FRESH FROZEN PLASMA
Unit division: 0
Unit division: 0

## 2018-07-11 LAB — PROTIME-INR
INR: 1.1 (ref 0.8–1.2)
Prothrombin Time: 14.2 seconds (ref 11.4–15.2)

## 2018-07-11 LAB — TRIGLYCERIDES: Triglycerides: 184 mg/dL — ABNORMAL HIGH (ref <150)

## 2018-07-11 MED ORDER — SODIUM CHLORIDE 0.9 % IV SOLN
INTRAVENOUS | Status: DC | PRN
  Administered 2018-07-11: 17:00:00 1000 mL via INTRAVENOUS

## 2018-07-11 MED ORDER — SODIUM CHLORIDE 0.9% FLUSH: 10.0000 mL | INTRAVENOUS | Status: DC | PRN

## 2018-07-11 MED ORDER — ORAL CARE MOUTH RINSE
15.0000 mL | Freq: Two times a day (BID) | OROMUCOSAL | Status: DC
  Administered 2018-07-11 – 2018-07-13 (×4): 15 mL via OROMUCOSAL

## 2018-07-11 MED FILL — Thrombin (Recombinant) For Soln 5000 Unit: CUTANEOUS | Qty: 5000 | Status: AC

## 2018-07-11 NOTE — Consult Note (Signed)
NAME:  Jennifer Richards, MRN:  [id number], DOB:  [birth year], LOS: 1 ADMISSION DATE:  07/10/2018, CONSULTATION DATE:  07/10/2018 REFERRING MD:  Dr. Vertell Limber, CHIEF COMPLAINT:  Subdural hemorrhage   Brief History   53 year old right-handed woman, presented hospital via EMS with altered mental status, dilated right pupil, nonresponsive, found to have subdural hematoma.  Noted to have fever, cough prior to admission.  COVID detected.  Past Medical History  DM, HTN, DVT on coumadin, bradycardia s/p PM  Significant Hospital Events   4/19 Craniotomy, evacuation of subdural hematoma  Consults:    Procedures:  4/19 ETT >> 4/19 A line >> 4/19 JP drain >>  Significant Diagnostic Tests:  4/19 CT head >> SDH on Rt with 12 mg Rt to Lt shift  Micro Data:  COVID 4/19 >> DETECTED  Antimicrobials:    Interim history/subjective:  Remains on vent.  Objective   Blood pressure (!) 153/71, pulse (!) 52, temperature 99.3 F (37.4 C), temperature source Oral, resp. rate 16, height 5\' 4"  (1.626 m), weight 59 kg, SpO2 100 %.    Vent Mode: PRVC FiO2 (%):  [40 %-50 %] 40 % Set Rate:  [16 bmp] 16 bmp Vt Set:  [430 mL] 430 mL PEEP:  [5 cmH20] 5 cmH20 Plateau Pressure:  [13 cmH20-18 cmH20] 15 cmH20   Intake/Output Summary (Last 24 hours) at 07/11/2018 1016 Last data filed at 07/11/2018 1000 Gross per 24 hour  Intake 1803.54 ml  Output 1105 ml  Net 698.54 ml   Filed Weights   07/10/18 0746  Weight: 59 kg    Examination:  General - somnolent, wakes up easily but then quickly back to sleep Eyes - pupils reactive ENT - ETT in place Cardiac - regular rate/rhythm, no murmur Chest - equal breath sounds b/l, no wheezing or rales Abdomen - soft, non tender, + bowel sounds Extremities - no cyanosis, clubbing, or edema Skin - no rashes Neuro - follows commands, moves extremities  Resolved Hospital Problem list     Assessment & Plan:   SDH s/p evacuation. Plan - post op care per neurosurgery  - tentative plan to d/c drain on 4/21 - keppra for seizure prophylaxis  Acute respiratory failure with hypoxia and compromised airway. Plan - mental status from sedation barrier to extubation  COVID positive >> she was infected prior to admission. Plan - monitor oxygenation, fever curve  Hx of DVT. Plan - hold coumadin  Hx of HTN, HLD. Plan - lotensin, HCTZ, crestor  DM type II. Plan - SSI - hold outpt metformin  Best practice:  Diet: NPO  DVT prophylaxis: SCDs GI prophylaxis: Protonix Mobility: bed Code Status: full Disposition: ICU  Labs    CMP Latest Ref Rng & Units 07/10/2018 07/10/2018  Glucose 70 - 99 mg/dL - 270(H)  BUN 6 - 20 mg/dL - 11  Creatinine 0.44 - 1.00 mg/dL 0.70 0.78  Sodium 135 - 145 mmol/L - 136  Potassium 3.5 - 5.1 mmol/L - 3.5  Chloride 98 - 111 mmol/L - 100  CO2 22 - 32 mmol/L - 23  Calcium 8.9 - 10.3 mg/dL - 9.2  Total Protein 6.5 - 8.1 g/dL - 6.9  Total Bilirubin 0.3 - 1.2 mg/dL - 0.4  Alkaline Phos 38 - 126 U/L - 111  AST 15 - 41 U/L - 18  ALT 0 - 44 U/L - 36    CBC Latest Ref Rng & Units 07/10/2018  WBC 4.0 - 10.5 K/uL 8.9  Hemoglobin 12.0 - 15.0  g/dL 11.1(L)  Hematocrit 36.0 - 46.0 % 35.9(L)  Platelets 150 - 400 K/uL 442(H)    CBG (last 3)  Recent Labs    07/10/18 2304 07/11/18 0313 07/11/18 0808  GLUCAP 142* 107* 111*    CC time 32 minutes  Chesley Mires, MD [facility] Pulmonary/Critical Care 07/11/2018, 10:24 AM

## 2018-07-11 NOTE — Progress Notes (Addendum)
Initial Nutrition Assessment  DOCUMENTATION CODES:   Not applicable  INTERVENTION:   If unable to extubate within next 24-48 hrs:  -Jevity 1.2 @ 35 ml/hr via OGT (840 ml) -30 ml Prostat TID -MVI with minerals  Provides: 1308 kcals, 92 grams protein, 679 ml free water. Meets 105% of calorie needs and 100% of protein needs.   NUTRITION DIAGNOSIS:   Increased nutrient needs related to post-op healing as evidenced by estimated needs.  GOAL:   Patient will meet greater than or equal to 90% of their needs  MONITOR:   Diet advancement, Weight trends, Labs, Vent status, I & O's, Skin, TF tolerance  REASON FOR ASSESSMENT:   Ventilator    ASSESSMENT:   Patient with PMH significant for DM, HTN, DVT, and bradycardia s/p PM. Presents this admission with AMS, found to have SDH.    4/19- craniotomy, evacuation of SDH  Pt is COVID-19 positive.   RD working remotely.  Spoke with RN via phone. Propofol d/c this am. Plan to extubate this afternoon once metal status clears. If pt unable to extubate within 24-48 hours recommend initiation of TF. Plan for pt to transfer to [facility] site once JP drain is removed.   Weight history limited in chart. Last weight recording of 77.6 kg was on 11/27/2016 at [facility]. Recommend obtaining recent weight as 59 kg looks to be stated.   Patient is currently intubated on ventilator support MV: 5.2 L/min Temp (24hrs), Avg:98.5 F (36.9 C), Min:97.5 F (36.4 C), Max:99.5 F (37.5 C)  I/O: +493 ml since admit UOP: 765 ml x 24 hrs  JP drain: 175 ml x 24 hrs   Drips: NS with 20 mEq KCl @ 50 ml/hr Medications: colace, SS novolog, MVI with minerals Labs: CBG 101-199  Diet Order:   Diet Order            Diet NPO time specified  Diet effective now              EDUCATION NEEDS:   Not appropriate for education at this time  Skin:  Skin Assessment: Skin Integrity Issues: Skin Integrity Issues:: Incisions Incisions: right head closed    Last BM:  PTA  Height:   Ht Readings from Last 1 Encounters:  07/10/18 5\' 4"  (1.626 m)    Weight:   Wt Readings from Last 1 Encounters:  07/10/18 59 kg    Ideal Body Weight:  54.5 kg  BMI:  Body mass index is 22.31 kg/m.  Estimated Nutritional Needs:   Kcal:  1248 kcal  Protein:  90-105 grams  Fluid:  >/= 1.3 L/day   Mariana Single RD, LDN Clinical Nutrition Pager # - [phone]

## 2018-07-11 NOTE — Progress Notes (Signed)
I called and spoke with patient's husband and updated him as to patient's progress and our plans to wean to extubate today, observe at [facility] until tomorrow and to remove drain and transfer to [facility] tomorrow if she is continuing to show improvement.  He is very grateful for our help in his wife's care.

## 2018-07-11 NOTE — Progress Notes (Addendum)
Subjective: Patient reports following commands  Objective: Vital signs in last 24 hours: Temp:  [97.5 F (36.4 C)-99.3 F (37.4 C)] 99.3 F (37.4 C) (04/20 0800) Pulse Rate:  [50-90] 50 (04/20 0843) Resp:  [14-22] 16 (04/20 0843) BP: (86-159)/(64-91) 145/81 (04/20 0843) SpO2:  [100 %] 100 % (04/20 0843) Arterial Line BP: (87-162)/(49-83) 149/70 (04/20 0820) FiO2 (%):  [40 %-50 %] 40 % (04/20 0843)  Intake/Output from previous day: 04/19 0701 - 04/20 0700 In: 1703.5 [I.V.:644.8; Blood:662.3; IV Piggyback:396.4] Out: 1040 [Urine:765; Drains:175; Blood:100] Intake/Output this shift: Total I/O In: 76.6 [I.V.:76.6] Out: 65 [Urine:45; Drains:20]  Physical Exam: Per RN:  Opening eyes, moving all extremities to command.  Pupils are now equal, round, reactive.  Drain patent with serosanguinous drainage.  Lab Results: Recent Labs    07/10/18 0735  WBC 8.9  HGB 11.1*  HCT 35.9*  PLT 442*   BMET Recent Labs    07/10/18 0735 07/10/18 0742  NA 136  --   K 3.5  --   CL 100  --   CO2 23  --   GLUCOSE 270*  --   BUN 11  --   CREATININE 0.78 0.70  CALCIUM 9.2  --     Studies/Results: Dg Abd 1 View  Result Date: 07/10/2018 CLINICAL DATA:  Evaluate OG tube placement EXAM: ABDOMEN - 1 VIEW COMPARISON:  None. FINDINGS: The OG tube terminates in the left upper quadrant, in the stomach. No other acute abnormalities. IMPRESSION: The OG tube terminates in the stomach. Electronically Signed   By: Dorise Bullion III M.D   On: 07/10/2018 12:30   Ct Head Wo Contrast  Result Date: 07/11/2018 CLINICAL DATA:  53 year old female with right subdural hematoma and intracranial mass effect. Postoperative day 1 craniotomy and hematoma evacuation. EXAM: CT HEAD WITHOUT CONTRAST TECHNIQUE: Contiguous axial images were obtained from the base of the skull through the vertex without intravenous contrast. COMPARISON:  Head CT without contrast 07/10/2018 FINDINGS: Brain: Right subdural drain placed  with substantially regressed right subdural hematoma. Small residual estimated at 3-4 millimeters and mostly hypo-to isodense. Trace hyperdense blood remains along the right lateral tentorium. Trace hyperdense para falcine blood now on series 3, image 24. Largely resolved intracranial mass effect and midline shift. Residual mild leftward midline shift of 4 millimeters. Improved right lateral ventricle patency. No ventriculomegaly. Normalized suprasellar and interpeduncular cisterns. Other basilar cisterns are normal. Gray-white matter differentiation within normal limits. No cortically based acute infarct identified. Vascular: No suspicious intracranial vascular hyperdensity. Skull: Right frontotemporal craniotomy. Otherwise stable and intact. Sinuses/Orbits: Scattered paranasal sinus opacification is stable. Tympanic cavities and mastoids remain clear. Other: Postoperative changes to right scalp with percutaneous drain in place. Skin staples. Disconjugate gaze, but otherwise negative orbit soft tissues. IMPRESSION: 1. Satisfactory appearance of right subdural hematoma evacuation with drain in place. - small residual 3-4 mm subdural hematoma with trace para falcine blood. - largely resolved intracranial mass effect; mild residual leftward midline shift of 4 mm. 2. No new intracranial abnormality. Electronically Signed   By: Genevie Ann M.D.   On: 07/11/2018 08:20   Ct Head Wo Contrast  Result Date: 07/10/2018 CLINICAL DATA:  Altered mental status. Lethargy and vomiting. Unknown if patient had recent injury. EXAM: CT HEAD WITHOUT CONTRAST TECHNIQUE: Contiguous axial images were obtained from the base of the skull through the vertex without intravenous contrast. COMPARISON:  None. FINDINGS: Brain: There is a subdural hematoma on the right, adjacent to the frontal, parietal, and temporal lobes.  Acute blood products are seen within the subdural hematoma. The maximum dimension of the subdural hematoma is 10 mm. There is  also subdural blood adjacent to the falx and along the right side of the tentorium. There appears to be a small amount of subarachnoid hemorrhage in the region of the medial left frontal lobe as seen on series 3, image 10. No other hemorrhage noted. The right-sided subdural hematoma results in right to left midline shift of 12 mm as measured on axial image 12. The right lateral ventricle is smaller than the left due to mass effect. There is significant partial effacement of the basal cisterns. The brainstem and cerebellum are normal. No acute cortical ischemia or infarct identified. Vascular: No hyperdense vessel or unexpected calcification. Skull: Normal. Negative for fracture or focal lesion. Sinuses/Orbits: There is partial opacification of the left frontal sinus and scattered ethmoid air cells. Remainder of the paranasal sinuses, mastoid air cells, and middle ears are normal. Other: None. IMPRESSION: 1. There is a subdural hematoma on the right with a maximum thickness of 10 mm, containing acute blood products. There also appears to be a small amount of subarachnoid hemorrhage adjacent to the medial right frontal lobe. The right subdural hematoma results in right to left midline shift of 12 mm and significant partial effacement of the basal cisterns. There is also mass effect on the right lateral ventricle. 2. Mild sinus disease as above. Findings called to [employer], the patient's PA. Electronically Signed   By: Dorise Bullion III M.D   On: 07/10/2018 08:16   Dg Chest Portable 1 View  Result Date: 07/10/2018 CLINICAL DATA:  53 year old female status post intubation, subdural hematoma EXAM: PORTABLE CHEST 1 VIEW COMPARISON:  None. FINDINGS: The patient has been intubated. The tip of the endotracheal tube is 1.3 cm above the carina. A left subclavian approach cardiac rhythm maintenance device is in position. Leads project over the right heart and right ventricle. Low inspiratory volumes with mild  perihilar atelectasis. No focal airspace consolidation, pulmonary edema, pleural effusion or pneumothorax. IMPRESSION: 1. The tip of the endotracheal tube is 1.3 cm above the carina. 2. Low inspiratory volumes with bilateral perihilar atelectasis. Otherwise, no acute cardiopulmonary process. Electronically Signed   By: Jacqulynn Cadet M.D.   On: 07/10/2018 09:08    Assessment/Plan: Head CT shows resolution of subdural hematoma with marked improvement in shift.  Basilar cisterns no longer effaced.  I have discussed situation with Dr. Halford Chessman.  Wean to extubate today.  Leave drain in place today.  Discontinue drain tomorrow if still doing well and drainage less bloody.  Transfer to [facility] if doing well tomorrow.  INR good today at 1.1.    LOS: 1 day    Peggyann Shoals, MD 07/11/2018, 9:05 AM

## 2018-07-12 DIAGNOSIS — Z86711 Personal history of pulmonary embolism: Secondary | ICD-10-CM

## 2018-07-12 LAB — CBC
HCT: 27.6 % — ABNORMAL LOW (ref 36.0–46.0)
Hemoglobin: 8.3 g/dL — ABNORMAL LOW (ref 12.0–15.0)
MCH: 25.5 pg — ABNORMAL LOW (ref 26.0–34.0)
MCHC: 30.1 g/dL (ref 30.0–36.0)
MCV: 84.7 fL (ref 80.0–100.0)
Platelets: 293 10*3/uL (ref 150–400)
RBC: 3.26 MIL/uL — ABNORMAL LOW (ref 3.87–5.11)
RDW: 13 % (ref 11.5–15.5)
WBC: 5.7 10*3/uL (ref 4.0–10.5)
nRBC: 0 % (ref 0.0–0.2)

## 2018-07-12 LAB — GLUCOSE, CAPILLARY
Glucose-Capillary: 73 mg/dL (ref 70–99)
Glucose-Capillary: 114 mg/dL — ABNORMAL HIGH (ref 70–99)
Glucose-Capillary: 113 mg/dL — ABNORMAL HIGH (ref 70–99)
Glucose-Capillary: 144 mg/dL — ABNORMAL HIGH (ref 70–99)
Glucose-Capillary: 122 mg/dL — ABNORMAL HIGH (ref 70–99)

## 2018-07-12 LAB — BASIC METABOLIC PANEL
Anion gap: 9 (ref 5–15)
BUN: 9 mg/dL (ref 6–20)
CO2: 25 mmol/L (ref 22–32)
Calcium: 8.4 mg/dL — ABNORMAL LOW (ref 8.9–10.3)
Chloride: 106 mmol/L (ref 98–111)
Creatinine, Ser: 0.79 mg/dL (ref 0.44–1.00)
GFR calc Af Amer: >60 mL/min (ref >60)
GFR calc non Af Amer: >60 mL/min (ref >60)
Glucose, Bld: 137 mg/dL — ABNORMAL HIGH (ref 70–99)
Potassium: 3.3 mmol/L — ABNORMAL LOW (ref 3.5–5.1)
Sodium: 140 mmol/L (ref 135–145)

## 2018-07-12 LAB — PROTIME-INR
INR: 1.2 (ref 0.8–1.2)
Prothrombin Time: 15.5 seconds — ABNORMAL HIGH (ref 11.4–15.2)

## 2018-07-12 LAB — MAGNESIUM: Magnesium: 2 mg/dL (ref 1.7–2.4)

## 2018-07-12 MED ORDER — INSULIN ASPART 100 UNIT/ML ~~LOC~~ SOLN
0.0000 [IU] | Freq: Three times a day (TID) | SUBCUTANEOUS | Status: DC
  Administered 2018-07-12 – 2018-07-13 (×3): 2 [IU] via SUBCUTANEOUS

## 2018-07-12 MED ORDER — POTASSIUM CHLORIDE 10 MEQ/100ML IV SOLN
10.0000 meq | INTRAVENOUS | Status: AC
  Administered 2018-07-12 (×4): 10 meq via INTRAVENOUS
  Filled 2018-07-12 (×4): qty 100

## 2018-07-12 MED ORDER — LEVETIRACETAM 500 MG PO TABS
500.0000 mg | ORAL_TABLET | Freq: Two times a day (BID) | ORAL | Status: DC
  Filled 2018-07-12: qty 1

## 2018-07-12 MED ORDER — INSULIN ASPART 100 UNIT/ML ~~LOC~~ SOLN: 0.0000 [IU] | Freq: Every day | SUBCUTANEOUS | Status: DC

## 2018-07-12 NOTE — Progress Notes (Signed)
Assisted tele visit to patient with husband.  Gerene Nedd Ann, RN  

## 2018-07-12 NOTE — Progress Notes (Signed)
[facility] Progress Note Patient Name: Jennifer Richards DOB: 1965-07-18 MRN: [id number]   Date of Service  07/12/2018  HPI/Events of Note  K 3.3, creatinine 0.79  eICU Interventions  K 40 meqs ordered, Mg level added     Intervention Category Major Interventions: Electrolyte abnormality - evaluation and management  Judd Lien 07/12/2018, 5:43 AM

## 2018-07-12 NOTE — Progress Notes (Addendum)
Subjective: Patient reports doing well.  Extubated and conversant.  Following commands.  Objective: Vital signs in last 24 hours: Temp:  [99 F (37.2 C)-99.3 F (37.4 C)] 99 F (37.2 C) (04/21 0000) Pulse Rate:  [54-84] 68 (04/21 1000) Resp:  [12-21] 21 (04/21 1300) BP: (118-153)/(51-90) 153/79 (04/21 1300) SpO2:  [91 %-100 %] 100 % (04/21 1200) Arterial Line BP: (120-166)/(58-71) 148/58 (04/20 1800) FiO2 (%):  [30 %] 30 % (04/20 1601)  Intake/Output from previous day: 04/20 0701 - 04/21 0700 In: 1551.1 [I.V.:1061; NG/GT:90; IV Piggyback:400.1] Out: 1880 [Urine:1665; Drains:215] Intake/Output this shift: Total I/O In: 595.6 [I.V.:267.4; IV Piggyback:328.3] Out: 795 [Urine:750; Drains:45]  Physical Exam: Pupils equal, round, reactive.  Still some ptosis on the right.  Dressing CDI.    Lab Results: Recent Labs    07/10/18 0735 07/12/18 0316  WBC 8.9 5.7  HGB 11.1* 8.3*  HCT 35.9* 27.6*  PLT 442* 293   BMET Recent Labs    07/10/18 0735 07/10/18 0742 07/12/18 0316  NA 136  --  140  K 3.5  --  3.3*  CL 100  --  106  CO2 23  --  25  GLUCOSE 270*  --  137*  BUN 11  --  9  CREATININE 0.78 0.70 0.79  CALCIUM 9.2  --  8.4*    Studies/Results: Ct Head Wo Contrast  Result Date: 07/11/2018 CLINICAL DATA:  53 year old female with right subdural hematoma and intracranial mass effect. Postoperative day 1 craniotomy and hematoma evacuation. EXAM: CT HEAD WITHOUT CONTRAST TECHNIQUE: Contiguous axial images were obtained from the base of the skull through the vertex without intravenous contrast. COMPARISON:  Head CT without contrast 07/10/2018 FINDINGS: Brain: Right subdural drain placed with substantially regressed right subdural hematoma. Small residual estimated at 3-4 millimeters and mostly hypo-to isodense. Trace hyperdense blood remains along the right lateral tentorium. Trace hyperdense para falcine blood now on series 3, image 24. Largely resolved intracranial mass  effect and midline shift. Residual mild leftward midline shift of 4 millimeters. Improved right lateral ventricle patency. No ventriculomegaly. Normalized suprasellar and interpeduncular cisterns. Other basilar cisterns are normal. Gray-white matter differentiation within normal limits. No cortically based acute infarct identified. Vascular: No suspicious intracranial vascular hyperdensity. Skull: Right frontotemporal craniotomy. Otherwise stable and intact. Sinuses/Orbits: Scattered paranasal sinus opacification is stable. Tympanic cavities and mastoids remain clear. Other: Postoperative changes to right scalp with percutaneous drain in place. Skin staples. Disconjugate gaze, but otherwise negative orbit soft tissues. IMPRESSION: 1. Satisfactory appearance of right subdural hematoma evacuation with drain in place. - small residual 3-4 mm subdural hematoma with trace para falcine blood. - largely resolved intracranial mass effect; mild residual leftward midline shift of 4 mm. 2. No new intracranial abnormality. Electronically Signed   By: Genevie Ann M.D.   On: 07/11/2018 08:20    Assessment/Plan: I have removed drain and placed staple at exit site.  Patient is doing well.  Discontinue Keppra.  Mobilize with PT.  OK to discharge home when getting around [facility].  D/w Dr. Halford Chessman and Noe Gens, NP.  Given COVID status, staples can stay in for up to 30 days prior to removal.  She can come to office to have these taken out after quarantine period is over.    LOS: 2 days    Peggyann Shoals, MD 07/12/2018, 3:08 PM

## 2018-07-12 NOTE — Progress Notes (Signed)
NAME:  Jennifer Richards, MRN:  [id number], DOB:  1965-11-06, LOS: 2 ADMISSION DATE:  07/10/2018, CONSULTATION DATE:  07/10/2018 REFERRING MD:  Dr. Vertell Limber, CHIEF COMPLAINT:  Subdural hemorrhage   Brief History   53 year old right-handed woman, presented hospital via EMS with altered mental status, dilated right pupil, nonresponsive, found to have subdural hematoma.  Noted to have fever, cough prior to admission.  COVID detected.  History of Present Illness  53 yo female had pacemaker inserted for symptomatic bradycardia.  This was complicated by pulmonary embolism.  She has been on coumadin therapy since.  Her husband works at [employer], and there was coworker who developed respiratory infection.  Her husband developed a fever and cough, and was advised by his PCP to self quarantine.  His symptoms resolved.  Jennifer Richards, also developed cough and fever.  Her symptoms resolved as well.  She then developed altered mental status and was brought to the ER where she was found to have an SDH.  Past Medical History  DM, HTN, bradycardia s/p PM, PE after pacemaker insertion  Significant Hospital Events   4/19 Craniotomy, evacuation of subdural hematoma  Consults:    Procedures:  4/19 ETT >> 4/20 4/19 A line >> 4/20 4/19 JP drain >>  Significant Diagnostic Tests:  4/19 CT head >> SDH on Rt with 12 mg Rt to Lt shift  Micro Data:  COVID 4/19 >> DETECTED  Antimicrobials:    Interim history/subjective:  Denies headache.  Swallowed pills this AM.    Objective   Blood pressure (!) 142/68, pulse 68, temperature 99 F (37.2 C), temperature source Oral, resp. rate 15, height 5\' 4"  (1.626 m), weight 59 kg, SpO2 97 %.    Vent Mode: CPAP;PSV FiO2 (%):  [30 %] 30 % Set Rate:  [12 bmp] 12 bmp Vt Set:  [430 mL] 430 mL PEEP:  [5 cmH20] 5 cmH20 Pressure Support:  [5 cmH20] 5 cmH20 Plateau Pressure:  [14 cmH20-15 cmH20] 14 cmH20   Intake/Output Summary (Last 24 hours) at 07/12/2018 1024  Last data filed at 07/12/2018 1000 Gross per 24 hour  Intake 1646.42 ml  Output 1950 ml  Net -303.58 ml   Filed Weights   07/10/18 0746  Weight: 59 kg    Examination:  General - alert Eyes - pupils reactive ENT - no sinus tenderness, no stridor Cardiac - regular rate/rhythm, no murmur Chest - equal breath sounds b/l, no wheezing or rales Abdomen - soft, non tender, + bowel sounds Extremities - no cyanosis, clubbing, or edema Skin - no rashes Neuro - follows commands, moves all extremities, JP drain in place  Resolved Hospital Problem list   Acute respiratory failure with compromised airway  Assessment & Plan:   SDH s/p evacuation. Plan - neurosurgery to assess for JP drain removal - defer to neurosurgery about duration of keppra - PT/OT assessment  COVID positive. Discussion: She was infected prior to admission.  No signs of active infection at this time.   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 Discussion: Her PE seems provoked.  She will not be able to resume anticoagulation for several weeks.  It will also needed to be determine whether she needs to resume anticoagulation in setting of presumed provoked PE. Plan - hold coumadin for now  Hx of HTN, HLD. Plan - lotensin, HCTZ, crestor  DM type II. Plan - SSI - resume metformin once she is tolerating diet  Hypokalemia. Plan -  f/u BMET  Anemia of critical illness. Plan - f/u CBC  Best practice:  Diet: Carb modified, heart healthy DVT prophylaxis: SCDs GI prophylaxis: No longer indicated Mobility: OOB Code Status: full Disposition: ICU  Labs    CMP Latest Ref Rng & Units 07/12/2018 07/10/2018 07/10/2018  Glucose 70 - 99 mg/dL 137(H) - 270(H)  BUN 6 - 20 mg/dL 9 - 11  Creatinine 0.44 - 1.00 mg/dL 0.79 0.70 0.78  Sodium 135 - 145 mmol/L 140 - 136  Potassium 3.5 - 5.1 mmol/L 3.3(L) - 3.5  Chloride 98 - 111 mmol/L 106 - 100  CO2 22 - 32 mmol/L 25 - 23   Calcium 8.9 - 10.3 mg/dL 8.4(L) - 9.2  Total Protein 6.5 - 8.1 g/dL - - 6.9  Total Bilirubin 0.3 - 1.2 mg/dL - - 0.4  Alkaline Phos 38 - 126 U/L - - 111  AST 15 - 41 U/L - - 18  ALT 0 - 44 U/L - - 36    CBC Latest Ref Rng & Units 07/12/2018 07/10/2018  WBC 4.0 - 10.5 K/uL 5.7 8.9  Hemoglobin 12.0 - 15.0 g/dL 8.3(L) 11.1(L)  Hematocrit 36.0 - 46.0 % 27.6(L) 35.9(L)  Platelets 150 - 400 K/uL 293 442(H)    CBG (last 3)  Recent Labs    07/11/18 2302 07/12/18 0311 07/12/18 0921  GLUCAP 87 73 114*    D/w Dr. Darlys Gales, MD [facility] Pulmonary/Critical Care 07/12/2018, 10:24 AM

## 2018-07-12 NOTE — Evaluation (Addendum)
Physical Therapy Evaluation Patient Details Name: Jennifer Richards MRN: [id number] DOB: 12/04/1965 Today's Date: 07/12/2018   History of Present Illness  53 yo female presenting with AMS and found to have subdural hematoma. Pt with INR of 6. S/p craniotomy 07/10/2018. Pt with fever at admission and found to be positive Covid 19. PMH including bradycardia s/p pacemaker, HTN, diabetes, DVT on coumadin  Clinical Impression  Pt presents to PT with decr mobility due to decr balance and generalized weakness. Expect pt will make rapid progress and be able to return home with husband. Expect pt will need 1-2 more days of PT prior to DC. Spoke with husband Reggie over the phone with pt's permission. Reggie is currently home quarantining and will be available to assist. Will continue to assess need for further PT.    Follow Up Recommendations Home health PT(Depends on progress)    Equipment Recommendations  Other (comment)(To be determined)    Recommendations for Other Services       Precautions / Restrictions Precautions Precautions: Fall Precaution Comments: Covid positive Restrictions Weight Bearing Restrictions: No      Mobility  Bed Mobility Overal bed mobility: Needs Assistance Bed Mobility: Supine to Sit;Sit to Supine     Supine to sit: Min assist Sit to supine: Min guard   General bed mobility comments: Assist to elevate trunk into sitting  Transfers Overall transfer level: Needs assistance Equipment used: 1 person hand held assist Transfers: Sit to/from Stand Sit to Stand: Min assist;Min guard         General transfer comment: Initially stand with min assist to bring hips up. Stood 3 more times with min guard  Ambulation/Gait Ambulation/Gait assistance: Herbalist (Feet): 6 Feet(x 2) Assistive device: 1 person hand held assist Gait Pattern/deviations: Step-through pattern;Decreased stride length Gait velocity: decr Gait velocity interpretation: <1.31  ft/sec, indicative of household ambulator General Gait Details: Assist for safety. Distance limited by IV tubing length due to pt Covid positive and IV pumps outside door of room.  Stairs            Wheelchair Mobility    Modified Rankin (Stroke Patients Only)       Balance Overall balance assessment: Needs assistance Sitting-balance support: No upper extremity supported;Feet supported Sitting balance-Leahy Scale: Good     Standing balance support: No upper extremity supported Standing balance-Leahy Scale: Fair                               Pertinent Vitals/Pain Pain Assessment: No/denies pain    Home Living Family/patient expects to be discharged to:: Private residence Living Arrangements: Spouse/significant other Available Help at Discharge: Family;Available 24 hours/day(husband initially due to quarantining for covid) Type of Home: House Home Access: Stairs to enter Entrance Stairs-Rails: Right Entrance Stairs-Number of Steps: 3-4 Home Layout: Two level;Able to live on main level with bedroom/bathroom Home Equipment: Other (comment)(to be determined)      Prior Function Level of Independence: Independent         Comments: Has worked for [employer] for 30 years and 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     Journalist, newspaper        Extremity/Trunk Assessment   Upper Extremity Assessment Upper Extremity Assessment: Defer to OT evaluation    Lower Extremity Assessment Lower Extremity Assessment: Generalized weakness       Communication   Communication: No difficulties  Cognition Arousal/Alertness:  Awake/alert Behavior During Therapy: WFL for tasks assessed/performed Overall Cognitive Status: Impaired/Different from baseline Area of Impairment: Problem solving;Memory                     Memory: Decreased short-term memory       Problem Solving: Slow processing        General Comments       Exercises     Assessment/Plan    PT Assessment Patient needs continued PT services  PT Problem List Decreased strength;Decreased balance;Decreased mobility;Decreased activity tolerance       PT Treatment Interventions DME instruction;Gait training;Functional mobility training;Therapeutic activities;Therapeutic exercise;Balance training;Patient/family education;Cognitive remediation    PT Goals (Current goals can be found in the Care Plan section)  Acute Rehab PT Goals Patient Stated Goal: return home PT Goal Formulation: With patient/family Time For Goal Achievement: 07/19/18 Potential to Achieve Goals: Good    Frequency Min 4X/week   Barriers to discharge Inaccessible home environment stairs to enter    Co-evaluation               AM-PAC PT "6 Clicks" Mobility  Outcome Measure Help needed turning from your back to your side while in a flat bed without using bedrails?: A Little Help needed moving from lying on your back to sitting on the side of a flat bed without using bedrails?: A Little Help needed moving to and from a bed to a chair (including a wheelchair)?: A Little Help needed standing up from a chair using your arms (e.g., wheelchair or bedside chair)?: A Little Help needed to walk in hospital room?: A Little Help needed climbing 3-5 steps with a railing? : A Little 6 Click Score: 18    End of Session   Activity Tolerance: Patient tolerated treatment well Patient left: in bed;with call bell/phone within reach;with chair alarm set Nurse Communication: Mobility status PT Visit Diagnosis: Other abnormalities of gait and mobility (R26.89)    Time: 1423-1500 PT Time Calculation (min) (ACUTE ONLY): 37 min   Charges:   PT Evaluation $PT Eval Moderate Complexity: 1 Mod PT Treatments $Gait Training: 8-22 mins        [place] Pager [phone] Office [place] 07/12/2018, 4:45 PM

## 2018-07-12 NOTE — Progress Notes (Signed)
OT Cancellation Note  Patient Details Name: Jennifer Richards MRN: [id number] DOB: November 18, 1965   Cancelled Treatment:    Reason Eval/Treat Not Completed: Patient not medically ready;Other (comment)(Waiting for neuro for JP drain removal. Will return as schedule allows. Thank you.)  [place], OTR/L Acute Rehab Pager: [phone] Office: [phone] 07/12/2018, 11:37 AM

## 2018-07-13 ENCOUNTER — Encounter (HOSPITAL_COMMUNITY): Payer: Self-pay | Admitting: Neurosurgery

## 2018-07-13 LAB — BASIC METABOLIC PANEL
Anion gap: 11 (ref 5–15)
BUN: 7 mg/dL (ref 6–20)
CO2: 25 mmol/L (ref 22–32)
Calcium: 8.6 mg/dL — ABNORMAL LOW (ref 8.9–10.3)
Chloride: 103 mmol/L (ref 98–111)
Creatinine, Ser: 0.75 mg/dL (ref 0.44–1.00)
GFR calc Af Amer: >60 mL/min (ref >60)
GFR calc non Af Amer: >60 mL/min (ref >60)
Glucose, Bld: 148 mg/dL — ABNORMAL HIGH (ref 70–99)
Potassium: 3.9 mmol/L (ref 3.5–5.1)
Sodium: 139 mmol/L (ref 135–145)

## 2018-07-13 LAB — GLUCOSE, CAPILLARY
Glucose-Capillary: 143 mg/dL — ABNORMAL HIGH (ref 70–99)
Glucose-Capillary: 140 mg/dL — ABNORMAL HIGH (ref 70–99)
Glucose-Capillary: 124 mg/dL — ABNORMAL HIGH (ref 70–99)

## 2018-07-13 LAB — CBC
HCT: 27.4 % — ABNORMAL LOW (ref 36.0–46.0)
Hemoglobin: 8.7 g/dL — ABNORMAL LOW (ref 12.0–15.0)
MCH: 26.8 pg (ref 26.0–34.0)
MCHC: 31.8 g/dL (ref 30.0–36.0)
MCV: 84.3 fL (ref 80.0–100.0)
Platelets: 293 10*3/uL (ref 150–400)
RBC: 3.25 MIL/uL — ABNORMAL LOW (ref 3.87–5.11)
RDW: 13 % (ref 11.5–15.5)
WBC: 4.4 10*3/uL (ref 4.0–10.5)
nRBC: 0 % (ref 0.0–0.2)

## 2018-07-13 LAB — MAGNESIUM: Magnesium: 2.2 mg/dL (ref 1.7–2.4)

## 2018-07-13 LAB — PROTIME-INR
INR: 1.3 — ABNORMAL HIGH (ref 0.8–1.2)
Prothrombin Time: 16 seconds — ABNORMAL HIGH (ref 11.4–15.2)

## 2018-07-13 NOTE — TOC Transition Note (Signed)
Transition of Care [facility]) - CM/SW Discharge Note   Patient Details  Name: EVELYN AGUINALDO MRN: [id number] Date of Birth: 25-Jul-1965  [facility]) CM/SW Contact:  Maryclare Labrador, RN Phone Number: 07/13/2018, 12:26 PM  Clinical Narrative:   Pt will discharge home today in care of husband.  Pt is covid positive however doesn't currently display any symptoms.  Pt does not need oxygen at discharge.  Pts husband given medicare.gov list via phone - husband chose [place] and agency accepted - agency informed pt is covid positive.  CM made follow up app for pt at PCP for 4/28 at 1:30 pm - office informed that pt is covid positive and will follow up with pt directly regarding specifics of appt.  No other CM needs identified - CM signing off    Final next level of care: [place] Barriers to Discharge: No Barriers Identified   Patient Goals and CMS Choice Patient states their goals for this hospitalization and ongoing recovery are:: (Pts husband stated "  i just want her back home so she can get better,  Im in quarantine myself so we will be fine")   Choice offered to / list presented to : Spouse  Discharge Placement                       Discharge Plan and Services                    HH Arranged: PT, OT [place] Agency: [place]   Social Determinants of Health (SDOH) Interventions     Readmission Risk Interventions No flowsheet data found.

## 2018-07-13 NOTE — Evaluation (Signed)
Occupational Therapy Evaluation Patient Details Name: Jennifer Richards MRN: [id number] DOB: 07-22-1965 Today's Date: 07/13/2018    History of Present Illness 53 yo female presenting with AMS and found to have subdural hematoma. Pt with INR of 6. S/p craniotomy 07/10/2018. Pt with fever at admission and found to be positive Covid 19. PMH including bradycardia s/p pacemaker, HTN, diabetes, DVT on coumadin   Clinical Impression   PTA, pt was living with her husband and was independent and working full time. Pt currently performing ADLs and functional mobility at supervision-Min Guard A level. Pt presenting with decreased balance and processing; noting increased deficits with fatigue. Discussed rehab options and pt agreeable to follow up therapy. Providing education in preparation for dc later today. Recommend dc to home with HHOT for further OT to optimize safety, independence with ADLs, and return to PLOF.      Follow Up Recommendations  Home health OT;Supervision/Assistance - 24 hour    Equipment Recommendations  None recommended by OT    Recommendations for Other Services       Precautions / Restrictions Precautions Precautions: Fall Precaution Comments: Covid positive Restrictions Weight Bearing Restrictions: No      Mobility Bed Mobility               General bed mobility comments: In recliner upon arrival  Transfers Overall transfer level: Needs assistance Equipment used: None Transfers: Sit to/from Stand Sit to Stand: Min guard;Supervision         General transfer comment: Supervision-Min Guard A for safety    Balance Overall balance assessment: Needs assistance Sitting-balance support: No upper extremity supported;Feet supported Sitting balance-Leahy Scale: Good     Standing balance support: No upper extremity supported Standing balance-Leahy Scale: Fair                             ADL either performed or assessed with clinical judgement    ADL Overall ADL's : Needs assistance/impaired                                       General ADL Comments: Pt performing ADLs at Supervision level as seen by getting dressed. During fucntional mobility, pt presenting with decreased balance and steadiness as she fatigued.     Vision Baseline Vision/History: Wears glasses Wears Glasses: At all times;Distance only Patient Visual Report: No change from baseline Additional Comments: Tracking [facility]     Perception     Praxis      Pertinent Vitals/Pain Pain Assessment: No/denies pain     Hand Dominance Right   Extremity/Trunk Assessment Upper Extremity Assessment Upper Extremity Assessment: RUE deficits/detail;LUE deficits/detail RUE Deficits / Details: Decreased speed and coordination. edema noted. generalized weakness. LUE Deficits / Details: Decreased speed and coordination. edema noted. generalized weakness.   Lower Extremity Assessment Lower Extremity Assessment: Defer to PT evaluation       Communication Communication Communication: No difficulties   Cognition Arousal/Alertness: Awake/alert Behavior During Therapy: WFL for tasks assessed/performed Overall Cognitive Status: Impaired/Different from baseline Area of Impairment: Problem solving;Memory                     Memory: Decreased short-term memory       Problem Solving: Slow processing General Comments: Pt with slow processing and requires increased time. Pt with decreased awareness of deficits. She states she isn't  at her normal but unable to state how    General Comments  VSS    Exercises     Shoulder Instructions      Home Living Family/patient expects to be discharged to:: Private residence Living Arrangements: Spouse/significant other Available Help at Discharge: Family;Available 24 hours/day(husband initially due to quarantining for covid) Type of Home: House Home Access: Stairs to enter [employer] of Steps:  3-4 Entrance Stairs-Rails: Right Home Layout: Two level;Able to live on main level with bedroom/bathroom Alternate Level Stairs-Number of Steps: flight   Bathroom Shower/Tub: Walk-in shower;Tub/shower unit   Bathroom Toilet: Handicapped height     Home Equipment: Shower seat          Prior Functioning/Environment Level of Independence: Independent        Comments: Has worked for [employer] for 30 years and 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        OT Problem List: Decreased strength;Decreased activity tolerance;Impaired balance (sitting and/or standing);Decreased knowledge of precautions;Decreased safety awareness;Decreased cognition;Impaired UE functional use      OT Treatment/Interventions:      OT Goals(Current goals can be found in the care plan section) Acute Rehab OT Goals Patient Stated Goal: "Go home and get stronger" OT Goal Formulation: All assessment and education complete, DC therapy  OT Frequency:     Barriers to D/C:            Co-evaluation              AM-PAC OT "6 Clicks" Daily Activity     Outcome Measure Help from another person eating meals?: None Help from another person taking care of personal grooming?: None Help from another person toileting, which includes using toliet, bedpan, or urinal?: None Help from another person bathing (including washing, rinsing, drying)?: None Help from another person to put on and taking off regular upper body clothing?: None Help from another person to put on and taking off regular lower body clothing?: None 6 Click Score: 24   End of Session Nurse Communication: Mobility status  Activity Tolerance: Patient tolerated treatment well Patient left: in chair;with call bell/phone within reach  OT Visit Diagnosis: Unsteadiness on feet (R26.81);Other abnormalities of gait and mobility (R26.89);Muscle weakness (generalized) (M62.81)                Time: 0174-9449 OT Time Calculation  (min): 39 min Charges:  OT General Charges $OT Visit: 1 Visit OT Evaluation $OT Eval Moderate Complexity: 1 Mod OT Treatments $Self Care/Home Management : 23-37 mins  [place], OTR/L Acute Rehab Pager: [phone] Office: [place] 07/13/2018, 12:45 PM

## 2018-07-13 NOTE — Discharge Summary (Addendum)
Physician Discharge Summary  Patient ID: Jennifer Richards MRN: [id number] DOB/AGE: [birth year] 54 y.o.  Admit date: 07/10/2018 Discharge date: 07/13/2018  Admission Diagnoses: Right Subdural Hematoma   Discharge Diagnoses:  Active Problems:   Subdural hematoma (HCC)   Subdural bleeding (HCC) HTN Diabetes  COVID+  H/O PE on Coumadin   Discharged Condition: good  Hospital Course: 53 year old female presents to ED on 4/19 with AMS, Dilated right pupil, non-responsive. CT head with Subdural Hematoma. Taken for Craniotomy Hematoma Evacuation. Remained intubated in post-operative setting. During hospital stay tested for COVID due to fever/cough. Test Positive. Extubated 4/20. On 4/22 patient stable with follow up appointments with PCP and Neurosurgery after 14 day quarantine period.    Subdural Hematoma Plan -Follow up with Neurosurgery for staple removal  -Continue to hold anticoagulation   HTN Plan -Continue home antihypertensive medications   Diabetes  Plan -Metformin   COVID+  Plan -Educated on quarantine period   H/O PE on Coumadin  Plan -Continue to hold Coumadin at this time  Consults: Neurosurgery   Significant Diagnostic Studies:  CT Head 4/19 > There is a subdural hematoma on the right with a maximum thickness of 10 mm, containing acute blood products. There also appears to be a small amount of subarachnoid hemorrhage adjacent to the medial right frontal lobe. The right subdural hematoma results in right to left midline shift of 12 mm and significant partial effacement of the basal cisterns. There is also mass effect on the right lateral ventricle. Mild sinus disease as above.  Discharge Exam: Blood pressure 128/69, pulse 81, temperature 98.5 F (36.9 C), temperature source Oral, resp. rate 16, height 5\' 4"  (1.626 m), weight 59 kg, SpO2 100 %. General appearance: alert and cooperative Head: Normocephalic, without obvious abnormality, atraumatic, Staples in  place to right scalp  Eyes: conjunctivae/corneas clear. PERRL, EOM's intact. Fundi benign. Resp: clear to auscultation bilaterally GI: soft, non-tender; bowel sounds normal; no masses,  no organomegaly Extremities: extremities normal, atraumatic, no cyanosis or edema Pulses: 2+ and symmetric Skin: Skin color, texture, turgor normal. No rashes or lesions Neurologic: Alert and oriented X 3, normal strength and tone. Normal symmetric reflexes. Normal coordination and gait  Disposition: Discharge disposition: 01-Home or Self Care       Discharge Instructions    Diet - low sodium heart healthy   Complete by:  As directed    Increase activity slowly   Complete by:  As directed      Allergies as of 07/13/2018   No Known Allergies     Medication List    STOP taking these medications   warfarin 5 MG tablet Commonly known as:  COUMADIN     TAKE these medications   benazepril-hydrochlorthiazide 20-25 MG tablet Commonly known as:  LOTENSIN HCT Take 1 tablet by mouth daily.   loratadine 10 MG tablet Commonly known as:  CLARITIN Take 10 mg by mouth daily.   metFORMIN 850 MG tablet Commonly known as:  GLUCOPHAGE Take 850 mg by mouth 2 (two) times daily.   multivitamin with minerals Tabs tablet Take 1 tablet by mouth daily.   POTASSIUM CHLORIDE PO Take 1 tablet by mouth daily.   rosuvastatin 10 MG tablet Commonly known as:  CRESTOR Take 10 mg by mouth daily.      Follow-up Information    [facility] Follow up.   Specialty:  Neurosurgery Why:  If you have not heard back please make an appoitment to have  staples removed once [place] period is over  Contact information: [address] [place] [address] [phone]           Signed: Omar Person 07/13/2018, 11:36 AM   Attending note: I have seen and examined the patient. History, labs and imaging reviewed.  Stable respiratory status.  Sitting in  chair at bedside.  On room air No complaints Drain removed neurosurgery yesterday  Blood pressure 128/69, pulse 81, temperature 98.5 F (36.9 C), temperature source Oral, resp. rate 16, height 5\' 4"  (1.626 m), weight 59 kg, SpO2 100 %. Gen:      No acute distress, craniotomy sutures.  Incision clean, dry HEENT:  EOMI, sclera anicteric Neck:     No masses; no thyromegaly Lungs:    Clear to auscultation bilaterally; normal respiratory effort CV:         Regular rate and rhythm; no murmurs Abd:      + bowel sounds; soft, non-tender; no palpable masses, no distension Ext:    No edema; adequate peripheral perfusion Skin:      Warm and dry; no rash Neuro: alert and oriented x 3 Psych: normal mood and affect   Assessment/plan: 53 year old with subdural hemorrhage status post evacuation, COVID infection Stable for discharge today Emphasized that she needs to maintain isolation precautions at home for at least 14 more days Follow-up with neurosurgery after precautions are lifted for staple removal  Jennifer Garfinkel MD [facility] 07/13/2018, 12:13 PM

## 2018-07-13 NOTE — OR Nursing (Signed)
Contacted today by 71M unit regarding patient earrings. Spoke with B. Moshe Cipro, Therapist, sports, who transported pt to 4N unit with T. Federini, RN. August Luz, RN states earrings were given to 4N RN, accepting pt., when pt was  transferred to the unit after surgery. Earrings were in a specimen cup with patient labels located on cup and cup lid.

## 2018-07-14 ENCOUNTER — Encounter: Payer: Self-pay | Admitting: Podiatry

## 2018-07-15 ENCOUNTER — Telehealth: Payer: Self-pay | Admitting: *Deleted

## 2018-07-15 NOTE — Care Management (Signed)
Pt discharged home COVID positive 4/22 - Per protocol - CM reached out to pt as follow up.  CM left VM for pt requesting callback.

## 2018-11-21 ENCOUNTER — Other Ambulatory Visit: Payer: Self-pay

## 2018-11-22 ENCOUNTER — Ambulatory Visit: Payer: BC Managed Care – PPO | Admitting: Obstetrics & Gynecology

## 2018-11-25 ENCOUNTER — Ambulatory Visit: Payer: BC Managed Care – PPO | Admitting: Obstetrics & Gynecology

## 2018-12-02 ENCOUNTER — Other Ambulatory Visit: Payer: Self-pay

## 2018-12-05 ENCOUNTER — Other Ambulatory Visit: Payer: Self-pay

## 2018-12-05 ENCOUNTER — Ambulatory Visit: Payer: BC Managed Care – PPO | Admitting: Obstetrics & Gynecology

## 2018-12-05 ENCOUNTER — Encounter: Payer: Self-pay | Admitting: Obstetrics & Gynecology

## 2018-12-05 VITALS — BP 130/82 | Ht 64.75 in | Wt 173.0 lb

## 2018-12-05 DIAGNOSIS — Z780 Asymptomatic menopausal state: Secondary | ICD-10-CM

## 2018-12-05 DIAGNOSIS — E663 Overweight: Secondary | ICD-10-CM

## 2018-12-05 DIAGNOSIS — Z1272 Encounter for screening for malignant neoplasm of vagina: Secondary | ICD-10-CM

## 2018-12-05 DIAGNOSIS — Z90710 Acquired absence of both cervix and uterus: Secondary | ICD-10-CM

## 2018-12-05 DIAGNOSIS — Z01419 Encounter for gynecological examination (general) (routine) without abnormal findings: Secondary | ICD-10-CM | POA: Diagnosis not present

## 2018-12-05 NOTE — Progress Notes (Signed)
GESELLE GIASSON 10/17/1965 [place]:[id number]   History:    53 y.o. G1P0A1 Married x 8 yrs  RP:  New patient presenting for annual gyn exam   HPI: S/P TAH.  Menopause x a few yrs.  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  Stopped Coumadin at that time.  No neurologic sequela.  No pelvic pain.  No pain with intercourse.  Urine and bowel movements normal.  Breasts normal.  Body mass index 29.01.  Health labs with family physician.  Will schedule colonoscopy through her family physician.  Past medical history,surgical history, family history and social history were all reviewed and documented in the EPIC chart.  Gynecologic History No LMP recorded (lmp unknown). Patient has had a hysterectomy. Contraception: status post hysterectomy Last Pap:  About >3 yrs ago, normal Last mammogram: Scheduling at the [place]: Never Colonoscopy: Scheduling through her Fam MD  Obstetric History OB History  Gravida Para Term Preterm AB Living  1       1 0  SAB TAB Ectopic Multiple Live Births      1        # Outcome Date GA Lbr Len/2nd Weight Sex Delivery Anes PTL Lv  1 Ectopic              ROS: A ROS was performed and pertinent positives and negatives are included in the history.  GENERAL: No fevers or chills. HEENT: No change in vision, no earache, sore throat or sinus congestion. NECK: No pain or stiffness. CARDIOVASCULAR: No chest pain or pressure. No palpitations. PULMONARY: No shortness of breath, cough or wheeze. GASTROINTESTINAL: No abdominal pain, nausea, vomiting or diarrhea, melena or bright red blood per rectum. GENITOURINARY: No urinary frequency, urgency, hesitancy or dysuria. MUSCULOSKELETAL: No joint or muscle pain, no back pain, no recent trauma. DERMATOLOGIC: No rash, no itching, no lesions. ENDOCRINE: No polyuria, polydipsia, no heat or cold intolerance. No recent change in weight. HEMATOLOGICAL: No anemia or easy bruising or bleeding. NEUROLOGIC: No headache, seizures, numbness, tingling  or weakness. PSYCHIATRIC: No depression, no loss of interest in normal activity or change in sleep pattern.     Exam:   BP 130/82   Ht 5' 4.75" (1.645 m)   Wt 173 lb (78.5 kg)   LMP  (LMP Unknown)   BMI 29.01 kg/m   Body mass index is 29.01 kg/m.  General appearance : Well developed well nourished female. No acute distress HEENT: Eyes: no retinal hemorrhage or exudates,  Neck supple, trachea midline, no carotid bruits, no thyroidmegaly Lungs: Clear to auscultation, no rhonchi or wheezes, or rib retractions  Heart: Regular rate and rhythm, no murmurs or gallops Breast:Examined in sitting and supine position were symmetrical in appearance, no palpable masses or tenderness,  no skin retraction, no nipple inversion, no nipple discharge, no skin discoloration, no axillary or supraclavicular lymphadenopathy Abdomen: no palpable masses or tenderness, no rebound or guarding Extremities: no edema or skin discoloration or tenderness  Pelvic: Vulva: Normal             Vagina: No gross lesions or discharge.  Pap reflex done.  Cervix/Uterus absent  Adnexa  Without masses or tenderness  Anus: Normal   Assessment/Plan:  53 y.o. female for annual exam   1. Encounter for Papanicolaou smear of vagina as part of routine gynecological examination Gynecologic exam status post TAH.  Pap reflex done at the vaginal vault.  Breast exam normal.  Screening mammogram to schedule at the breast center now.  Colonoscopy  will be scheduled through her family physician.  Health labs with family physician.  2. S/P TAH (total abdominal hysterectomy)  3. Postmenopause Postmenopausal, well on no hormone replacement therapy.  Vitamin D supplements, calcium intake of 1200 mg daily and regular weightbearing physical activity is recommended.  4. Overweight (BMI 25.0-29.9) Recommend a lower calorie/carb diet such as [employer].  Aerobic physical activities 5 times a week and weightlifting every 2 days.  Other  orders - Omega-3 Fatty Acids (FISH OIL) 1000 MG CAPS; Take by mouth. - Biotin 5 MG CAPS; Take by mouth.  Princess Bruins MD, 11:21 AM 12/05/2018

## 2018-12-07 LAB — PAP IG W/ RFLX HPV ASCU

## 2018-12-11 ENCOUNTER — Encounter: Payer: Self-pay | Admitting: Obstetrics & Gynecology

## 2018-12-11 NOTE — Patient Instructions (Signed)
1. Encounter for Papanicolaou smear of vagina as part of routine gynecological examination Gynecologic exam status post TAH.  Pap reflex done at the vaginal vault.  Breast exam normal.  Screening mammogram to schedule at the breast center now.  Colonoscopy will be scheduled through her family physician.  Health labs with family physician.  2. S/P TAH (total abdominal hysterectomy)  3. Postmenopause Postmenopausal, well on no hormone replacement therapy.  Vitamin D supplements, calcium intake of 1200 mg daily and regular weightbearing physical activity is recommended.  4. Overweight (BMI 25.0-29.9) Recommend a lower calorie/carb diet such as [employer].  Aerobic physical activities 5 times a week and weightlifting every 2 days.  Other orders - Omega-3 Fatty Acids (FISH OIL) 1000 MG CAPS; Take by mouth. - Biotin 5 MG CAPS; Take by mouth.  Jennifer Richards, it was a pleasure seeing you today!  I will inform you of your results as soon as they are available.

## 2019-07-05 ENCOUNTER — Ambulatory Visit
Admission: RE | Admit: 2019-07-05 | Discharge: 2019-07-05 | Disposition: A | Discharge status: Home / Self Care | Payer: BC Managed Care – PPO | Source: Ambulatory Visit | Attending: Internal Medicine | Admitting: Internal Medicine

## 2019-07-05 ENCOUNTER — Other Ambulatory Visit: Payer: Self-pay | Admitting: Internal Medicine

## 2019-07-05 DIAGNOSIS — M5441 Lumbago with sciatica, right side: Secondary | ICD-10-CM

## 2020-03-20 ENCOUNTER — Other Ambulatory Visit: Payer: Self-pay | Admitting: Internal Medicine

## 2020-03-20 DIAGNOSIS — Z1231 Encounter for screening mammogram for malignant neoplasm of breast: Secondary | ICD-10-CM

## 2020-04-29 ENCOUNTER — Other Ambulatory Visit: Payer: Self-pay

## 2020-04-29 ENCOUNTER — Ambulatory Visit
Admission: RE | Admit: 2020-04-29 | Discharge: 2020-04-29 | Disposition: A | Discharge status: Home / Self Care | Payer: BC Managed Care – PPO | Source: Ambulatory Visit | Attending: Internal Medicine | Admitting: Internal Medicine

## 2020-04-29 DIAGNOSIS — Z1231 Encounter for screening mammogram for malignant neoplasm of breast: Secondary | ICD-10-CM

## 2020-12-08 ENCOUNTER — Other Ambulatory Visit: Payer: Self-pay

## 2020-12-08 ENCOUNTER — Emergency Department (HOSPITAL_COMMUNITY): Payer: BC Managed Care – PPO

## 2020-12-08 ENCOUNTER — Encounter (HOSPITAL_COMMUNITY): Payer: Self-pay | Admitting: Emergency Medicine

## 2020-12-08 ENCOUNTER — Emergency Department (HOSPITAL_COMMUNITY)
Admission: EM | Admit: 2020-12-08 | Discharge: 2020-12-09 | Disposition: A | Discharge status: Home / Self Care | Payer: BC Managed Care – PPO | Attending: Emergency Medicine | Admitting: Emergency Medicine

## 2020-12-08 DIAGNOSIS — S0990XA Unspecified injury of head, initial encounter: Secondary | ICD-10-CM

## 2020-12-08 DIAGNOSIS — R55 Syncope and collapse: Secondary | ICD-10-CM

## 2020-12-08 DIAGNOSIS — I63543 Cerebral infarction due to unspecified occlusion or stenosis of bilateral cerebellar arteries: Secondary | ICD-10-CM | POA: Insufficient documentation

## 2020-12-08 DIAGNOSIS — Y92410 Unspecified street and highway as the place of occurrence of the external cause: Secondary | ICD-10-CM | POA: Insufficient documentation

## 2020-12-08 DIAGNOSIS — Z950 Presence of cardiac pacemaker: Secondary | ICD-10-CM | POA: Diagnosis not present

## 2020-12-08 DIAGNOSIS — Y9301 Activity, walking, marching and hiking: Secondary | ICD-10-CM | POA: Insufficient documentation

## 2020-12-08 DIAGNOSIS — Z7984 Long term (current) use of oral hypoglycemic drugs: Secondary | ICD-10-CM | POA: Diagnosis not present

## 2020-12-08 DIAGNOSIS — W01198A Fall on same level from slipping, tripping and stumbling with subsequent striking against other object, initial encounter: Secondary | ICD-10-CM | POA: Diagnosis not present

## 2020-12-08 DIAGNOSIS — I10 Essential (primary) hypertension: Secondary | ICD-10-CM | POA: Diagnosis not present

## 2020-12-08 DIAGNOSIS — E119 Type 2 diabetes mellitus without complications: Secondary | ICD-10-CM | POA: Insufficient documentation

## 2020-12-08 DIAGNOSIS — J45909 Unspecified asthma, uncomplicated: Secondary | ICD-10-CM | POA: Insufficient documentation

## 2020-12-08 DIAGNOSIS — Z79899 Other long term (current) drug therapy: Secondary | ICD-10-CM | POA: Diagnosis not present

## 2020-12-08 LAB — BASIC METABOLIC PANEL
Anion gap: 8 (ref 5–15)
BUN: 10 mg/dL (ref 6–20)
CO2: 23 mmol/L (ref 22–32)
Calcium: 9.9 mg/dL (ref 8.9–10.3)
Chloride: 107 mmol/L (ref 98–111)
Creatinine, Ser: 1.05 mg/dL — ABNORMAL HIGH (ref 0.44–1.00)
GFR, Estimated: >60 mL/min (ref >60)
Glucose, Bld: 141 mg/dL — ABNORMAL HIGH (ref 70–99)
Potassium: 4.3 mmol/L (ref 3.5–5.1)
Sodium: 138 mmol/L (ref 135–145)

## 2020-12-08 LAB — CBC WITH DIFFERENTIAL/PLATELET
Abs Immature Granulocytes: 0.01 10*3/uL (ref 0.00–0.07)
Basophils Absolute: 0 10*3/uL (ref 0.0–0.1)
Basophils Relative: 0 %
Eosinophils Absolute: 0.1 10*3/uL (ref 0.0–0.5)
Eosinophils Relative: 3 %
HCT: 35.3 % — ABNORMAL LOW (ref 36.0–46.0)
Hemoglobin: 11.2 g/dL — ABNORMAL LOW (ref 12.0–15.0)
Immature Granulocytes: 0 %
Lymphocytes Relative: 16 %
Lymphs Abs: 0.8 10*3/uL (ref 0.7–4.0)
MCH: 26.6 pg (ref 26.0–34.0)
MCHC: 31.7 g/dL (ref 30.0–36.0)
MCV: 83.8 fL (ref 80.0–100.0)
Monocytes Absolute: 0.4 10*3/uL (ref 0.1–1.0)
Monocytes Relative: 7 %
Neutro Abs: 3.8 10*3/uL (ref 1.7–7.7)
Neutrophils Relative %: 74 %
Platelets: 228 10*3/uL (ref 150–400)
RBC: 4.21 MIL/uL (ref 3.87–5.11)
RDW: 13.8 % (ref 11.5–15.5)
WBC: 5.2 10*3/uL (ref 4.0–10.5)
nRBC: 0 % (ref 0.0–0.2)

## 2020-12-08 LAB — BRAIN NATRIURETIC PEPTIDE: B Natriuretic Peptide: 343.1 pg/mL — ABNORMAL HIGH (ref 0.0–100.0)

## 2020-12-08 LAB — TROPONIN I (HIGH SENSITIVITY): Troponin I (High Sensitivity): 6 ng/L (ref <18)

## 2020-12-08 NOTE — ED Provider Notes (Signed)
Signout from Dr. Eliberto Ivory female who had an unresponsive episode while walking after she got out of the car. Fell and Hit the back of her head.  Had brief unresponsive episode with some possible twitching and foaming at the mouth.  Return to normal mental status within 30 seconds.  Currently back to baseline.  Had preceding lightheadedness prior to event.  No prior seizure history. Physical Exam  BP 114/67   Pulse 85   Temp 98.2 F (36.8 C) (Oral)   Resp (!) 25   LMP  (LMP Unknown)   SpO2 100%   Physical Exam  ED Course/Procedures     Procedures  MDM  Plan is to follow-up on neuro recommendations and follow-up labs.  Disposition per results.  Discussed with Dr. Curly Shores from neurology.  She did not feel this sounded seizure-like and feel that fit more with syncope.  She does not feel she needs admission or even outpatient neurology follow-up currently.  Recommend would follow-up with PCP for now.  Does not need seizure restrictions.  Discussed with patient and her husband.  She is ambulated in the department without any difficulty.  Still remains intermittently dizzy.  Recommended close follow-up with her primary care doctor and have referred on to outpatient neurology.  Return instructions discussed       Hayden Rasmussen, MD 12/09/20 [phone]

## 2020-12-08 NOTE — Discharge Instructions (Signed)
You were seen in the emergency department for evaluation of a fainting spell in which she struck your head.  You had a CAT scan of your head along with lab work and an EKG.  Neurology is recommending outpatient follow-up in the neurology clinic.  Please keep well-hydrated and follow-up with your primary care doctor also.  Return to the emergency department if any worsening or concerning symptoms

## 2020-12-08 NOTE — ED Notes (Signed)
Patient transported to CT 

## 2020-12-08 NOTE — ED Provider Notes (Signed)
[facility] EMERGENCY DEPARTMENT Provider Note   CSN: [id number] Arrival date & time: 12/08/20  1743     History Chief Complaint  Patient presents with   Loss of Consciousness    Jennifer Richards is a 55 y.o. female.  Patient is a 55 yo female present with husband for LOC. Pt was walking to garage when she had witnessed LOC. Pt admits to feelings of nausea, unwellness, and light headedness that occurred directly prior to LOC. Pt fell on concrete, blunt head trauma, no current blood thinner use. Husband states patient had LOC for approx 20 seconds with eyes rolled back and shaking of upper extremities, with gurgling noises. States she was confused for approx 2-3 minutes and return to baseline. Denies recent fevers, chills, nausea, vomiting, or diarrhea. States she felt unwell this past Wednesday and had BP check 94/71. Followed with pcp and was taken off home BP medications. Recently lost 20 lbs after exercise and diet changes.    The history is provided by the patient. No language interpreter was used.  Loss of Consciousness Associated symptoms: no chest pain, no fever, no palpitations, no seizures, no shortness of breath and no vomiting       Past Medical History:  Diagnosis Date   Anticoagulated on Coumadin    Asthma    Asthmatic bronchitis    Diabetes mellitus without complication (HCC)    Hx of pulmonary embolus    Hypertension    Obesity (BMI 30.0-34.9)    Pacemaker    Sinus arrest     Patient Active Problem List   Diagnosis Date Noted   Subdural hematoma ([place]) 07/10/2018   Subdural bleeding ([place]) 07/10/2018   Anticoagulated 11/27/2016   Dry eyes 11/27/2016   Dry mouth 11/27/2016    Past Surgical History:  Procedure Laterality Date   ABDOMINAL HYSTERECTOMY     ANTERIOR CRUCIATE LIGAMENT REPAIR     right knee   CRANIOTOMY Right 07/10/2018   Procedure: CRANIOTOMY HEMATOMA EVACUATION SUBDURAL;  Surgeon: Erline Levine, MD;  Location: [facility];   Service: Neurosurgery;  Laterality: Right;   KNEE SURGERY     PACEMAKER IMPLANT     PACEMAKER INSERTION       OB History     Gravida  1   Para      Term      Preterm      AB  1   Living  0      SAB      IAB      Ectopic  1   Multiple      Live Births              Family History  Problem Relation Age of Onset   Diabetes Mother     Social History   Tobacco Use   Smoking status: Never   Smokeless tobacco: Never  Vaping Use   Vaping Use: Never used  Substance Use Topics   Alcohol use: Yes    Comment: occasional   Drug use: Never    Home Medications Prior to Admission medications   Medication Sig Start Date End Date Taking? Authorizing Provider  beclomethasone (QVAR) 80 MCG/ACT inhaler Inhale 2 puffs into the lungs 2 (two) times daily.    [provider]  benazepril-hydrochlorthiazide (LOTENSIN HCT) 20-25 MG per tablet Take 1 tablet by mouth daily.    [provider]  Biotin 5 MG CAPS Take by mouth.    [provider]  loratadine (  CLARITIN) 10 MG tablet Take 10 mg by mouth daily.    [provider]  metFORMIN (GLUCOPHAGE) 850 MG tablet Take 850 mg by mouth 2 (two) times daily with a meal.    [provider]  Multiple Vitamin (MULTIVITAMIN WITH MINERALS) TABS tablet Take 1 tablet by mouth daily.    [provider]  Omega-3 Fatty Acids (FISH OIL) 1000 MG CAPS Take by mouth.    [provider]  POTASSIUM CHLORIDE PO Take 1 tablet by mouth daily.    [provider]  potassium chloride SA (K-DUR,KLOR-CON) 20 MEQ tablet Take 20 mEq by mouth daily.    [provider]  rosuvastatin (CRESTOR) 10 MG tablet Take 10 mg by mouth daily.    [provider]    Allergies    Xarelto [rivaroxaban]  Review of Systems   Review of Systems  Constitutional:  Negative for chills and fever.  HENT:  Negative for ear pain and sore throat.   Eyes:  Negative for pain and visual  disturbance.  Respiratory:  Negative for cough and shortness of breath.   Cardiovascular:  Positive for syncope. Negative for chest pain and palpitations.  Gastrointestinal:  Negative for abdominal pain and vomiting.  Genitourinary:  Negative for dysuria and hematuria.  Musculoskeletal:  Negative for arthralgias and back pain.  Skin:  Negative for color change and rash.  Neurological:  Positive for syncope. Negative for seizures.  All other systems reviewed and are negative.  Physical Exam Updated Vital Signs BP 115/64   Pulse 90   Temp 98.2 F (36.8 C) (Oral)   Resp (!) 23   LMP  (LMP Unknown)   SpO2 100%   Physical Exam Vitals and nursing note reviewed.  Constitutional:      General: She is not in acute distress.    Appearance: She is well-developed.  HENT:     Head: Normocephalic and atraumatic.  Eyes:     General: Lids are normal. Vision grossly intact.     Conjunctiva/sclera: Conjunctivae normal.     Pupils: Pupils are equal, round, and reactive to light.  Cardiovascular:     Rate and Rhythm: Normal rate and regular rhythm.     Heart sounds: No murmur heard. Pulmonary:     Effort: Pulmonary effort is normal. No respiratory distress.     Breath sounds: Normal breath sounds.  Abdominal:     Palpations: Abdomen is soft.     Tenderness: There is no abdominal tenderness.  Musculoskeletal:     Cervical back: Neck supple.  Skin:    General: Skin is warm and dry.  Neurological:     Mental Status: She is alert.     GCS: GCS eye subscore is 4. GCS verbal subscore is 5. GCS motor subscore is 6.     Cranial Nerves: Cranial nerves are intact.     Sensory: Sensation is intact.     Motor: Motor function is intact.     Coordination: Coordination is intact.    ED Results / Procedures / Treatments   Labs (all labs ordered are listed, but only abnormal results are displayed) Labs Reviewed - No data to display  EKG None  Radiology No results  found.  Procedures Procedures   Medications Ordered in ED Medications - No data to display  ED Course  I have reviewed the triage vital signs and the nursing notes.  Pertinent labs & imaging results that were available during my care of the patient were reviewed  by me and considered in my medical decision making (see chart for details).    MDM Rules/Calculators/A&P                           9:36 PM 55 yo female present with husband for LOC. Pt Aox3, no acute distress, afebrile, with stable vitals. Physical exam demonstrates no neurovascular deficits.   Patient signed out to oncoming physician while awaiting cardiovascular workup and imaging. Will continue to monitor for seizure like activity.      Final Clinical Impression(s) / ED Diagnoses Final diagnoses:  Syncope and collapse  Injury of head, initial encounter    Rx / DC Orders ED Discharge Orders     None        Lianne Cure, DO [id number]

## 2020-12-08 NOTE — ED Triage Notes (Signed)
Intermittent dizziness/lightheadedness x 1 1/2 weeks.  Seen by PCP and had labs on Friday.  Went on a 2 hour road trip today and ate.  When she returned home she got out of car and had a syncopal episode and hit back of head.  No blood thinners.  Denies pain.  Pt has a pacemaker.  Denies dizziness at present.

## 2020-12-18 ENCOUNTER — Emergency Department (HOSPITAL_COMMUNITY)
Admission: EM | Admit: 2020-12-18 | Discharge: 2020-12-19 | Disposition: A | Discharge status: Other Acute Inpatient Hospital | Payer: BC Managed Care – PPO | Attending: Emergency Medicine | Admitting: Emergency Medicine

## 2020-12-18 ENCOUNTER — Emergency Department (HOSPITAL_COMMUNITY): Payer: BC Managed Care – PPO

## 2020-12-18 DIAGNOSIS — Z7951 Long term (current) use of inhaled steroids: Secondary | ICD-10-CM | POA: Insufficient documentation

## 2020-12-18 DIAGNOSIS — R001 Bradycardia, unspecified: Secondary | ICD-10-CM | POA: Insufficient documentation

## 2020-12-18 DIAGNOSIS — Z7984 Long term (current) use of oral hypoglycemic drugs: Secondary | ICD-10-CM | POA: Insufficient documentation

## 2020-12-18 DIAGNOSIS — I442 Atrioventricular block, complete: Secondary | ICD-10-CM

## 2020-12-18 DIAGNOSIS — Z20822 Contact with and (suspected) exposure to covid-19: Secondary | ICD-10-CM | POA: Insufficient documentation

## 2020-12-18 DIAGNOSIS — Z79899 Other long term (current) drug therapy: Secondary | ICD-10-CM | POA: Insufficient documentation

## 2020-12-18 DIAGNOSIS — R55 Syncope and collapse: Secondary | ICD-10-CM | POA: Diagnosis not present

## 2020-12-18 DIAGNOSIS — Z950 Presence of cardiac pacemaker: Secondary | ICD-10-CM | POA: Insufficient documentation

## 2020-12-18 DIAGNOSIS — R42 Dizziness and giddiness: Secondary | ICD-10-CM | POA: Diagnosis present

## 2020-12-18 DIAGNOSIS — E119 Type 2 diabetes mellitus without complications: Secondary | ICD-10-CM | POA: Diagnosis not present

## 2020-12-18 DIAGNOSIS — I10 Essential (primary) hypertension: Secondary | ICD-10-CM | POA: Diagnosis not present

## 2020-12-18 DIAGNOSIS — J45909 Unspecified asthma, uncomplicated: Secondary | ICD-10-CM | POA: Insufficient documentation

## 2020-12-18 LAB — COMPREHENSIVE METABOLIC PANEL
ALT: 28 U/L (ref 0–44)
AST: 20 U/L (ref 15–41)
Albumin: 3.4 g/dL — ABNORMAL LOW (ref 3.5–5.0)
Alkaline Phosphatase: 98 U/L (ref 38–126)
Anion gap: 8 (ref 5–15)
BUN: 8 mg/dL (ref 6–20)
CO2: 23 mmol/L (ref 22–32)
Calcium: 9.5 mg/dL (ref 8.9–10.3)
Chloride: 105 mmol/L (ref 98–111)
Creatinine, Ser: 1.03 mg/dL — ABNORMAL HIGH (ref 0.44–1.00)
GFR, Estimated: >60 mL/min (ref >60)
Glucose, Bld: 136 mg/dL — ABNORMAL HIGH (ref 70–99)
Potassium: 4 mmol/L (ref 3.5–5.1)
Sodium: 136 mmol/L (ref 135–145)
Total Bilirubin: 0.6 mg/dL (ref 0.3–1.2)
Total Protein: 6.5 g/dL (ref 6.5–8.1)

## 2020-12-18 LAB — CBC WITH DIFFERENTIAL/PLATELET
Abs Immature Granulocytes: 0.01 10*3/uL (ref 0.00–0.07)
Basophils Absolute: 0 10*3/uL (ref 0.0–0.1)
Basophils Relative: 0 %
Eosinophils Absolute: 0.2 10*3/uL (ref 0.0–0.5)
Eosinophils Relative: 3 %
HCT: 36.1 % (ref 36.0–46.0)
Hemoglobin: 11.2 g/dL — ABNORMAL LOW (ref 12.0–15.0)
Immature Granulocytes: 0 %
Lymphocytes Relative: 15 %
Lymphs Abs: 0.9 10*3/uL (ref 0.7–4.0)
MCH: 26.2 pg (ref 26.0–34.0)
MCHC: 31 g/dL (ref 30.0–36.0)
MCV: 84.5 fL (ref 80.0–100.0)
Monocytes Absolute: 0.4 10*3/uL (ref 0.1–1.0)
Monocytes Relative: 7 %
Neutro Abs: 4.5 10*3/uL (ref 1.7–7.7)
Neutrophils Relative %: 75 %
Platelets: 264 10*3/uL (ref 150–400)
RBC: 4.27 MIL/uL (ref 3.87–5.11)
RDW: 14 % (ref 11.5–15.5)
WBC: 6.1 10*3/uL (ref 4.0–10.5)
nRBC: 0 % (ref 0.0–0.2)

## 2020-12-18 LAB — RESP PANEL BY RT-PCR (FLU A&B, COVID) ARPGX2
Influenza A by PCR: NEGATIVE
Influenza B by PCR: NEGATIVE
SARS Coronavirus 2 by RT PCR: NEGATIVE

## 2020-12-18 LAB — PROTIME-INR
INR: 1 (ref 0.8–1.2)
Prothrombin Time: 13.6 seconds (ref 11.4–15.2)

## 2020-12-18 LAB — MAGNESIUM: Magnesium: 1.8 mg/dL (ref 1.7–2.4)

## 2020-12-18 LAB — TROPONIN I (HIGH SENSITIVITY): Troponin I (High Sensitivity): 8 ng/L (ref <18)

## 2020-12-18 MED ORDER — BENZONATATE 100 MG PO CAPS
100.0000 mg | ORAL_CAPSULE | Freq: Once | ORAL | Status: AC
  Administered 2020-12-18: 100 mg via ORAL
  Filled 2020-12-18: qty 1

## 2020-12-18 NOTE — ED Triage Notes (Signed)
Pt BIB [facility] from home c/o dizziness and pacemaker problems. Per ems, pt stated her pacemaker is not working appropriately. Pt's HR was 46 upon ems arrival. Denies SOB & CP. A&O X4. Pt states she had a syncopal episode on Sunday.   -200 ml ns given by EMS.    BP 148/66

## 2020-12-18 NOTE — ED Notes (Signed)
Pt ambulatory to restroom and back. Husband at bedside. No complaints at this time.

## 2020-12-18 NOTE — ED Notes (Signed)
[place] . bed # [id number]  [phone]  Fatima Sanger.    [phone]

## 2020-12-18 NOTE — ED Provider Notes (Signed)
[facility] EMERGENCY DEPARTMENT Provider Note   CSN: [id number] Arrival date & time: 12/18/20  1656     History No chief complaint on file.   Jennifer Richards is a 55 y.o. female history of diabetes, hypertension, heart block, here presenting with dizziness and bradycardia and near syncope.  Patient had a Holter monitor placed several days ago at [facility].  Patient already has a pacemaker and the RV lead has not been functioning and was checked at [facility] several days ago and the battery life is about 13% at that time.  She states that she just came back from the store and walked upstairs and felt very lightheaded and dizzy.  She states that she sat down and noticed that her heart rate was 46 on her watch.  She states that she had her legs up and her heart rate went up to the 80s and dropped back down to the 40s.  She states that she felt dizzy again.  Denies any actual syncope or any chest pain.  She states that her pacemaker has been done at [facility]  The history is provided by the patient.      Past Medical History:  Diagnosis Date   Anticoagulated on Coumadin    Asthma    Asthmatic bronchitis    Diabetes mellitus without complication ([place])    Hx of pulmonary embolus    Hypertension    Obesity (BMI 30.0-34.9)    Pacemaker    Sinus arrest     Patient Active Problem List   Diagnosis Date Noted   Subdural hematoma ([place]) 07/10/2018   Subdural bleeding ([place]) 07/10/2018   Anticoagulated 11/27/2016   Dry eyes 11/27/2016   Dry mouth 11/27/2016    Past Surgical History:  Procedure Laterality Date   ABDOMINAL HYSTERECTOMY     ANTERIOR CRUCIATE LIGAMENT REPAIR     right knee   CRANIOTOMY Right 07/10/2018   Procedure: CRANIOTOMY HEMATOMA EVACUATION SUBDURAL;  Surgeon: Erline Levine, MD;  Location: [place];  Service: Neurosurgery;  Laterality: Right;   KNEE SURGERY     PACEMAKER IMPLANT     PACEMAKER INSERTION       OB History     Gravida  1   Para      Term       Preterm      AB  1   Living  0      SAB      IAB      Ectopic  1   Multiple      Live Births              Family History  Problem Relation Age of Onset   Diabetes Mother     Social History   Tobacco Use   Smoking status: Never   Smokeless tobacco: Never  Vaping Use   Vaping Use: Never used  Substance Use Topics   Alcohol use: Yes    Comment: occasional   Drug use: Never    Home Medications Prior to Admission medications   Medication Sig Start Date End Date Taking? Authorizing Provider  beclomethasone (QVAR) 80 MCG/ACT inhaler Inhale 2 puffs into the lungs 2 (two) times daily.   Yes [provider]  benazepril-hydrochlorthiazide (LOTENSIN HCT) 20-25 MG per tablet Take 0.5 tablets by mouth every other day.   Yes [provider]  CINNAMON PO Take 2 capsules by mouth daily.   Yes [provider]  loratadine (CLARITIN) 10 MG tablet Take 10  mg by mouth daily.   Yes [provider]  metFORMIN (GLUCOPHAGE) 850 MG tablet Take 850 mg by mouth 2 (two) times daily with a meal.   Yes [provider]  Multiple Vitamin (MULTIVITAMIN WITH MINERALS) TABS tablet Take 1 tablet by mouth daily.   Yes [provider]  Omega-3 Fatty Acids (FISH OIL) 1000 MG CAPS Take 1 capsule by mouth daily.   Yes [provider]  Potassium 99 MG TABS Take 1 tablet by mouth daily.   Yes [provider]  rosuvastatin (CRESTOR) 10 MG tablet Take 10 mg by mouth daily.   Yes [provider]  Vitamin D, Cholecalciferol, 25 MCG (1000 UT) CAPS Take 2,000 Units by mouth daily.   Yes [provider]    Allergies    Xarelto [rivaroxaban]  Review of Systems   Review of Systems  Neurological:  Positive for dizziness.  All other systems reviewed and are negative.  Physical Exam Updated Vital Signs BP 112/61   Pulse (!) 51   Temp 98.2 F (36.8 C) (Oral)   Resp 17   LMP  (LMP Unknown)   SpO2 (!) 86%    Physical Exam Vitals and nursing note reviewed.  HENT:     Head: Normocephalic.     Nose: Nose normal.     Mouth/Throat:     Mouth: Mucous membranes are moist.  Eyes:     Extraocular Movements: Extraocular movements intact.     Pupils: Pupils are equal, round, and reactive to light.  Cardiovascular:     Rate and Rhythm: Normal rate and regular rhythm.     Pulses: Normal pulses.     Heart sounds: Normal heart sounds.  Pulmonary:     Effort: Pulmonary effort is normal.     Breath sounds: Normal breath sounds.  Abdominal:     General: Abdomen is flat.     Palpations: Abdomen is soft.  Musculoskeletal:        General: Normal range of motion.     Cervical back: Normal range of motion and neck supple.  Skin:    General: Skin is warm.     Capillary Refill: Capillary refill takes less than 2 seconds.  Neurological:     General: No focal deficit present.     Mental Status: She is alert and oriented to person, place, and time.  Psychiatric:        Mood and Affect: Mood normal.        Behavior: Behavior normal.    ED Results / Procedures / Treatments   Labs (all labs ordered are listed, but only abnormal results are displayed) Labs Reviewed  CBC WITH DIFFERENTIAL/PLATELET - Abnormal; Notable for the following components:      Result Value   Hemoglobin 11.2 (*)    All other components within normal limits  COMPREHENSIVE METABOLIC PANEL - Abnormal; Notable for the following components:   Glucose, Bld 136 (*)    Creatinine, Ser 1.03 (*)    Albumin 3.4 (*)    All other components within normal limits  RESP PANEL BY RT-PCR (FLU A&B, COVID) ARPGX2  MAGNESIUM  PROTIME-INR  TROPONIN I (HIGH SENSITIVITY)  TROPONIN I (HIGH SENSITIVITY)    EKG EKG Interpretation  Date/Time:  Wednesday December 18 2020 17:00:56 EDT Ventricular Rate:  102 PR Interval:    QRS Duration: 70 QT Interval:  426 QTC Calculation: 473 R Axis:   74 Text Interpretation: complete heart block  Borderline repolarization abnormality Lead(s) aVL V2  were not used for morphology analysis This is changed since previous Confirmed by Wandra Arthurs ([id number]) on 12/18/2020 5:32:33 PM  Radiology DG Chest Port 1 View  Result Date: 12/18/2020 CLINICAL DATA:  Pacemaker placement in a 55 year old female. EXAM: PORTABLE CHEST 1 VIEW COMPARISON:  December 08, 2020 FINDINGS: Dual lead pacer device in place. Leads with similar appearance to prior imaging. Power pack projects over the LEFT chest. Device projects over the central chest likely external to the patient. Trachea is midline. Cardiomediastinal contours and hilar structures are normal. Patchy bilateral areas of nodularity are present more so in the upper than lower lobes. No sign of pneumothorax. No visible effusion. Nodular changes not seen on recent comparison imaging. On limited assessment there is no acute skeletal process. EKG leads project over the chest. IMPRESSION: Pacemaker device in place with leads with similar appearance to prior imaging. Patchy bilateral areas of nodularity are present more so in the upper than lower lobes. Findings are suspicious for multifocal infection given rapid development since previous imaging. Would include the possibility of septic emboli though distribution is more cephalad than expected. Suggest follow-up to ensure resolution. Electronically Signed   By: Zetta Bills M.D.   On: 12/18/2020 17:43    Procedures Procedures    CRITICAL CARE Performed by: Wandra Arthurs   Total critical care time: 30  minutes  Critical care time was exclusive of separately billable procedures and treating other patients.  Critical care was necessary to treat or prevent imminent or life-threatening deterioration.  Critical care was time spent personally by me on the following activities: development of treatment plan with patient and/or surrogate as well as nursing, discussions with consultants, evaluation of patient's response to  treatment, examination of patient, obtaining history from patient or surrogate, ordering and performing treatments and interventions, ordering and review of laboratory studies, ordering and review of radiographic studies, pulse oximetry and re-evaluation of patient's condition.  Medications Ordered in ED Medications - No data to display  ED Course  I have reviewed the triage vital signs and the nursing notes.  Pertinent labs & imaging results that were available during my care of the patient were reviewed by me and considered in my medical decision making (see chart for details).    MDM Rules/Calculators/A&P                           LEWANDA PEREA is a 55 y.o. female here presenting with dizziness.  Patient appears to be in complete heart block.  I interrogated the pacemaker. Patient's pacemaker is sensing but not pacing.  We will check electrolytes and consult EP  9:14 PM Electrolytes unremarkable. Consulted cardiologist, who talked to Dr. Caryl Comes. He called the [employer] tech to come and reprogram device   9:14 PM [employer] is at bedside.  Patient went back into complete heart block with heart rate in the 40s. I talked to Dr. Caryl Comes and cardiology fellow is also involved.  They both recommend transfer back to [facility] for pacemaker replacement. Dr. Margaretann Loveless called cardiology fellow at [facility]- Dr. Legrand Como. Accepting doctor is Dr. Westley Gambles. Stable to transfer to [facility].   10:10 PM [facility] transfer center will accept patient to the ED.   Final Clinical Impression(s) / ED Diagnoses Final diagnoses:  None    Rx / DC Orders ED Discharge Orders     None        Drenda Freeze, MD 12/18/20 2211

## 2020-12-18 NOTE — ED Notes (Signed)
Dr. Rancour at bedside. 

## 2020-12-19 NOTE — ED Notes (Addendum)
[facility] at bedside to transfer patient. Report given to [facility]. EMTALA completed and printed - given to [facility]

## 2020-12-31 ENCOUNTER — Ambulatory Visit
Admission: RE | Admit: 2020-12-31 | Discharge: 2020-12-31 | Disposition: A | Discharge status: Home / Self Care | Payer: BC Managed Care – PPO | Source: Ambulatory Visit | Attending: Internal Medicine | Admitting: Internal Medicine

## 2020-12-31 ENCOUNTER — Other Ambulatory Visit: Payer: Self-pay | Admitting: Internal Medicine

## 2020-12-31 ENCOUNTER — Other Ambulatory Visit: Payer: Self-pay

## 2020-12-31 DIAGNOSIS — R059 Cough, unspecified: Secondary | ICD-10-CM

## 2020-12-31 DIAGNOSIS — R9389 Abnormal findings on diagnostic imaging of other specified body structures: Secondary | ICD-10-CM

## 2021-01-06 ENCOUNTER — Ambulatory Visit: Payer: BC Managed Care – PPO | Admitting: Neurology

## 2021-01-06 ENCOUNTER — Encounter: Payer: Self-pay | Admitting: Neurology

## 2021-01-06 VITALS — BP 116/74 | HR 80 | Ht 65.0 in | Wt 161.0 lb

## 2021-01-06 DIAGNOSIS — R55 Syncope and collapse: Secondary | ICD-10-CM

## 2021-01-06 DIAGNOSIS — I459 Conduction disorder, unspecified: Secondary | ICD-10-CM

## 2021-01-06 NOTE — Progress Notes (Signed)
[facility]  PATIENT: Jennifer Richards DOB: 09-23-1965  REFERRING CLINICIAN: Hayden Rasmussen, MD HISTORY FROM: Patient  REASON FOR VISIT: Syncope    HISTORICAL  CHIEF COMPLAINT:  Chief Complaint  Patient presents with   New Patient (Initial Visit)    Room 12. Pt is alone.     HISTORY OF PRESENT ILLNESS:  This is a 55 year old woman with past medical history of hypertension, hyperlipidemia, heart block s/p cardiac pacemaker placement who was referred to neurology for a syncope work-up.  Patient stated in the past few months he has been having episodes of extreme fatigue, she will have to stop and catch her breath and she will also have a period of dizziness.  On September 18 she actually passed out and hit her head.  She states she felt dizzy and prior to the event.  Since the 18 Sept, she been having more frequent episodes of dizziness and on the 28 Sept, she had a episode associated with a low heart rate, she checked her heart rate and it was 40 and did not improve after rest.  She presented to [facility] ED, where she was found to be in complete heart block.  Patient was transferred to [facility] for further management (she is followed at [facility] for the pacemaker). Patient was admitted for 1 day and she had a new pacemaker placed.  Since being discharged home, the episodes of extreme fatigue or having to stop and catch her breath improved.  But she still having recurrent episodes of dizziness when she is going from a laying down to sitting position or from sitting to standing.  This episode last 15-0 sec, will resolve after 15 -20 sec and she is able to walk without fall or without any additional dizziness.  Denies any additional falls.    OTHER MEDICAL CONDITIONS: DM, HTN, HLD, Complete heart block,   REVIEW OF SYSTEMS: Full 14 system review of systems performed and negative with exception of: As noted in the HPI  ALLERGIES: Allergies  Allergen  Reactions   Xarelto [Rivaroxaban] Other (See Comments)     Loss of smell, weight loss and eye problems    HOME MEDICATIONS: Outpatient Medications Prior to Visit  Medication Sig Dispense Refill   beclomethasone (QVAR) 80 MCG/ACT inhaler Inhale 2 puffs into the lungs 2 (two) times daily.     benazepril-hydrochlorthiazide (LOTENSIN HCT) 20-25 MG per tablet Take 0.5 tablets by mouth every other day.     CINNAMON PO Take 2 capsules by mouth daily.     loratadine (CLARITIN) 10 MG tablet Take 10 mg by mouth daily.     meclizine (ANTIVERT) 12.5 MG tablet Take 12.5-25 mg by mouth 2 (two) times daily as needed.     metFORMIN (GLUCOPHAGE) 850 MG tablet Take 850 mg by mouth 2 (two) times daily with a meal.     Multiple Vitamin (MULTIVITAMIN WITH MINERALS) TABS tablet Take 1 tablet by mouth daily.     Omega-3 Fatty Acids (FISH OIL) 1000 MG CAPS Take 1 capsule by mouth daily.     Potassium 99 MG TABS Take 1 tablet by mouth daily.     rosuvastatin (CRESTOR) 10 MG tablet Take 10 mg by mouth daily.     Vitamin D, Cholecalciferol, 25 MCG (1000 UT) CAPS Take 2,000 Units by mouth daily.     No facility-administered medications prior to visit.    PAST MEDICAL HISTORY: Past Medical History:  Diagnosis Date   Anticoagulated  on Coumadin    Asthma    Asthmatic bronchitis    Diabetes mellitus without complication (HCC)    Hx of pulmonary embolus    Hypertension    Obesity (BMI 30.0-34.9)    Pacemaker    Sinus arrest     PAST SURGICAL HISTORY: Past Surgical History:  Procedure Laterality Date   ABDOMINAL HYSTERECTOMY     ANTERIOR CRUCIATE LIGAMENT REPAIR     right knee   CRANIOTOMY Right 07/10/2018   Procedure: CRANIOTOMY HEMATOMA EVACUATION SUBDURAL;  Surgeon: Erline Levine, MD;  Location: [facility];  Service: Neurosurgery;  Laterality: Right;   KNEE SURGERY     PACEMAKER IMPLANT     PACEMAKER INSERTION      FAMILY HISTORY: Family History  Problem Relation Age of Onset   Diabetes Mother      SOCIAL HISTORY: Social History   Socioeconomic History   Marital status: Married    Spouse name: Not on file   Number of children: Not on file   Years of education: Not on file   Highest education level: Not on file  Occupational History   Not on file  Tobacco Use   Smoking status: Never   Smokeless tobacco: Never  Vaping Use   Vaping Use: Never used  Substance and Sexual Activity   Alcohol use: Yes    Comment: occasional   Drug use: Never   Sexual activity: Yes    Partners: Male    Comment: 1st intercourse- 42, partners- 37, married- 8 yrs  Other Topics Concern   Not on file  Social History Narrative   ** Merged History Encounter **       Social Determinants of Health   Financial Resource Strain: Not on file  Food Insecurity: Not on file  Transportation Needs: Not on file  Physical Activity: Not on file  Stress: Not on file  Social Connections: Not on file  Intimate Partner Violence: Not on file     PHYSICAL EXAM  GENERAL EXAM/CONSTITUTIONAL: Vitals:  Vitals:   01/06/21 0926  BP: 116/74  Pulse: 80  Weight: 161 lb (73 kg)  Height: 5\' 5"  (1.651 m)   Body mass index is 26.79 kg/m. Wt Readings from Last 3 Encounters:  01/06/21 161 lb (73 kg)  12/05/18 173 lb (78.5 kg)  07/10/18 130 lb (59 kg)   Patient is in no distress; well developed, nourished and groomed; neck is supple  EYES: Pupils round and reactive to light, Visual fields full to confrontation, Extraocular movements intacts,   MUSCULOSKELETAL: Gait, strength, tone, movements noted in Neurologic exam below  NEUROLOGIC: MENTAL STATUS:  No flowsheet data found. awake, alert, oriented to person, place and time recent and remote memory intact normal attention and concentration language fluent, comprehension intact, naming intact fund of knowledge appropriate  CRANIAL NERVE:  2nd, 3rd, 4th, 6th - pupils equal and reactive to light, visual fields full to confrontation, extraocular  muscles intact, no nystagmus 5th - facial sensation symmetric 7th - facial strength symmetric 8th - hearing intact 9th - palate elevates symmetrically, uvula midline 11th - shoulder shrug symmetric 12th - tongue protrusion midline  MOTOR:  normal bulk and tone, full strength in the BUE, BLE  SENSORY:  normal and symmetric to light touch, pinprick, temperature, vibration  COORDINATION:  finger-nose-finger, fine finger movements normal  REFLEXES:  deep tendon reflexes present and symmetric  GAIT/STATION:  normal    DIAGNOSTIC DATA (LABS, IMAGING, TESTING) - I reviewed patient records, labs, notes, testing and  imaging myself where available.  Lab Results  Component Value Date   WBC 6.1 12/18/2020   HGB 11.2 (L) 12/18/2020   HCT 36.1 12/18/2020   MCV 84.5 12/18/2020   PLT 264 12/18/2020      Component Value Date/Time   NA 136 12/18/2020 1704   K 4.0 12/18/2020 1704   CL 105 12/18/2020 1704   CO2 23 12/18/2020 1704   GLUCOSE 136 (H) 12/18/2020 1704   BUN 8 12/18/2020 1704   CREATININE 1.03 (H) 12/18/2020 1704   CALCIUM 9.5 12/18/2020 1704   PROT 6.5 12/18/2020 1704   ALBUMIN 3.4 (L) 12/18/2020 1704   AST 20 12/18/2020 1704   ALT 28 12/18/2020 1704   ALKPHOS 98 12/18/2020 1704   BILITOT 0.6 12/18/2020 1704   GFRNONAA >60 12/18/2020 1704   GFRAA >60 07/13/2018 0206   Lab Results  Component Value Date   TRIG 184 (H) 07/11/2018   No results found for: HGBA1C No results found for: VITAMINB12 No results found for: TSH    ASSESSMENT AND PLAN  55 y.o. year old female with recurrent episode of dizziness, and 1 fall, was found to be in complete heart block.  She is now status post new pacemaker placed on September 29.  Since the placement of new pacemaker patient denies any additional a history of dyspnea, no fatigue but she is having 15-22nd episode of dizziness when going from a sitting to standing position or from lying to sitting.  She denies any additional  fall.  Denies any seizure risk factors.  I have explained to the patient that her syncopal episode was likely due to her bradycardia secondary to heart block.  There is improvement in her symptoms since having the new pacemaker.  Advised patient to follow-up with her cardiologist (she reports she does not have a regular cardiologist, just electrophysiologist at [facility]) and possibly have referral for cardiac physical therapy.  From a neurological standpoint no further testing needed, advised patient to return or call us for any additional concerns or questions.   1. Syncope, unspecified syncope type   2. Heart block     PLAN: Continue with your current medication  Follow up with your PCP as scheduled.   No orders of the defined types were placed in this encounter.   No orders of the defined types were placed in this encounter.   Return if symptoms worsen or fail to improve.    Alric Ran, MD 01/06/2021, 10:01 AM  [facility] [address], [place], [place] [address] [phone]

## 2021-01-06 NOTE — Patient Instructions (Signed)
Continue with your current medication  Follow up with your PCP as scheduled.

## 2021-01-21 ENCOUNTER — Other Ambulatory Visit: Payer: Self-pay

## 2021-01-21 ENCOUNTER — Encounter: Payer: Self-pay | Admitting: Emergency Medicine

## 2021-01-21 ENCOUNTER — Ambulatory Visit (INDEPENDENT_AMBULATORY_CARE_PROVIDER_SITE_OTHER): Payer: BC Managed Care – PPO | Admitting: Emergency Medicine

## 2021-01-21 VITALS — BP 124/78 | HR 86 | Temp 98.3°F | Ht 66.0 in | Wt 163.0 lb

## 2021-01-21 DIAGNOSIS — R053 Chronic cough: Secondary | ICD-10-CM

## 2021-01-21 DIAGNOSIS — R911 Solitary pulmonary nodule: Secondary | ICD-10-CM

## 2021-01-21 DIAGNOSIS — R9389 Abnormal findings on diagnostic imaging of other specified body structures: Secondary | ICD-10-CM | POA: Diagnosis not present

## 2021-01-21 NOTE — Progress Notes (Signed)
Subjective:    Patient ID: Jennifer Richards, female    DOB: 1965/05/14, 55 y.o.   MRN: [id number]  HPI 55 year old never smoker with history of pulmonary embolism (2003), mitral valve prolapse diabetes, hypertension, third-degree heart block post pacemaker, with abandoned RV lead.  Underwent pacemaker replacement to dual-chamber 11/2020.  Also with a history of SDH.  Has been given a diagnosis of asthma in past, has been on QVAR but not not currently, xopenex, loratadine. Describes a significant improvement in her energy level when she had the pacer replaced. She has some brown raised patchy rash on her arms, chest, legs, these go back as fare as 2018. Her principle sx are cough, dyspnea. Has some benefit from albuterol.   [facility]went [facility] chest [facility] 01/08/21, report available to me but no images at this time.  Apparently showed a irregular scattered bilateral pulmonary nodules, new compared with 01/12/2017 CT abdomen, with irregular margins, largest 12 mm in the left lower lobe, no cavitation.  Bilateral mediastinal and hilar lymph adenopathy present.  Multiple small low-attenuation hepatic lesions, also new.  Based on review of her notes from Dr. Marlou Sa she has a positive ANA.  An ACE level was obtained by Dr Marlou Sa 10/10 was < 5.     Review of Systems As per HPI  Past Medical History:  Diagnosis Date   Anticoagulated on Coumadin    Asthma    Asthmatic bronchitis    Diabetes mellitus without complication (HCC)    Hx of pulmonary embolus    Hypertension    Obesity (BMI 30.0-34.9)    Pacemaker    Sinus arrest      Family History  Problem Relation Age of Onset   Diabetes Mother      Social History   Socioeconomic History   Marital status: Married    Spouse name: Not on file   Number of children: Not on file   Years of education: Not on file   Highest education level: Not on file  Occupational History   Not on file  Tobacco Use   Smoking status: Never   Smokeless tobacco:  Never  Vaping Use   Vaping Use: Never used  Substance and Sexual Activity   Alcohol use: Yes    Comment: occasional   Drug use: Never   Sexual activity: Yes    Partners: Male    Comment: 1st intercourse- 30, partners- 55, married- 8 yrs  Other Topics Concern   Not on file  Social History Narrative   ** Merged History Encounter **       Social Determinants of Health   Financial Resource Strain: Not on file  Food Insecurity: Not on file  Transportation Needs: Not on file  Physical Activity: Not on file  Stress: Not on file  Social Connections: Not on file  Intimate Partner Violence: Not on file      Allergies  Allergen Reactions   Xarelto [Rivaroxaban] Other (See Comments)     Loss of smell, weight loss and eye problems     Outpatient Medications Prior to Visit  Medication Sig Dispense Refill   beclomethasone (QVAR) 80 MCG/ACT inhaler Inhale 2 puffs into the lungs 2 (two) times daily.     benazepril-hydrochlorthiazide (LOTENSIN HCT) 20-25 MG per tablet Take 0.5 tablets by mouth every other day.     CINNAMON PO Take 2 capsules by mouth daily.     loratadine (CLARITIN) 10 MG tablet Take 10 mg by mouth daily.  meclizine (ANTIVERT) 12.5 MG tablet Take 12.5-25 mg by mouth 2 (two) times daily as needed.     metFORMIN (GLUCOPHAGE) 850 MG tablet Take 850 mg by mouth 2 (two) times daily with a meal.     Multiple Vitamin (MULTIVITAMIN WITH MINERALS) TABS tablet Take 1 tablet by mouth daily.     Omega-3 Fatty Acids (FISH OIL) 1000 MG CAPS Take 1 capsule by mouth daily.     Potassium 99 MG TABS Take 1 tablet by mouth daily.     rosuvastatin (CRESTOR) 10 MG tablet Take 10 mg by mouth daily.     Vitamin D, Cholecalciferol, 25 MCG (1000 UT) CAPS Take 2,000 Units by mouth daily.     No facility-administered medications prior to visit.         Objective:   Physical Exam Vitals:   01/21/21 1409  BP: 124/78  Pulse: 86  Temp: 98.3 F (36.8 C)  TempSrc: Oral  SpO2: 100%   Weight: 163 lb (73.9 kg)  Height: 5\' 6"  (1.676 m)    Gen: Pleasant, well-nourished, in no distress,  normal affect  ENT: No lesions,  mouth clear,  oropharynx clear, no postnasal drip  Neck: No JVD, no stridor  Lungs: No use of accessory muscles, no crackles or wheezing on normal respiration, no wheeze on forced expiration  Cardiovascular: RRR, heart sounds normal, no murmur or gallops, no peripheral edema  Musculoskeletal: No deformities, no cyanosis or clubbing  Neuro: alert, awake, non focal  Skin: Warm, hyperpigmented round flat rash on arms legs torso of various ages      Assessment & Plan:  Abnormal CT of the chest We will work on getting copies of her CT imaging.  Currently showed irregular bilateral pulmonary nodular disease and some mediastinal and hilar lymphadenopathy, suspicious for sarcoidosis especially in light of her rash.  Check serologies We will perform blood work today (ANA, ANCA, repeat ACE level, Sjogren panel, RF) We will refer you to dermatology to have your rash evaluated and biopsied Follow with Dr. Lamonte Sakai next available so we can review your blood work, your CT scan and decide which other work-up will be helpful.  Chronic cough On ACE inhibitor but consider also obstructive lung disease.  Has been treated for possible asthma in the past.  Need to quantify her degree of obstruction.  We will talk to Dr. Marlou Sa about possibly changing her benazepril/HCTZ to an alternative that is not associated with coughing Keep your Xopenex available to use 2 puffs up to every 6 hours if needed for shortness of breath, coughing.   We may decide to perform pulmonary function testing at some point going forward to better evaluate asthma   Baltazar Apo, MD, PhD 03/06/2021, 8:00 AM [facility] [phone] or if no answer before 7:00PM call [phone] For any issues after 7:00PM please call eLink [phone]

## 2021-01-21 NOTE — Patient Instructions (Signed)
We will obtain a copy of your CT scan of the chest from [facility]. We will perform blood work today (ANA, ANCA, repeat ACE level, Sjogren panel, RF) We will refer you to dermatology to have your rash evaluated and biopsied Talk to Dr. Marlou Sa about possibly changing her benazepril/HCTZ to an alternative that is not associated with coughing Keep your Xopenex available to use 2 puffs up to every 6 hours if needed for shortness of breath, coughing.   We may decide to perform pulmonary function testing at some point going forward to better evaluate asthma Follow with Dr. Lamonte Sakai next available so we can review your blood work, your CT scan and decide which other work-up will be helpful.

## 2021-01-23 LAB — ANTI-NUCLEAR AB-TITER (ANA TITER): ANA Titer 1: 1:80 {titer} — ABNORMAL HIGH

## 2021-01-23 LAB — SJOGREN'S SYNDROME ANTIBODS(SSA + SSB)
SSA (Ro) (ENA) Antibody, IgG: 3.1 AI — AB
SSB (La) (ENA) Antibody, IgG: <1 AI

## 2021-01-23 LAB — ANA: Anti Nuclear Antibody (ANA): POSITIVE — AB

## 2021-01-23 LAB — ANGIOTENSIN CONVERTING ENZYME: Angiotensin-Converting Enzyme: <5 U/L — ABNORMAL LOW (ref 9–67)

## 2021-01-23 LAB — ANCA SCREEN W REFLEX TITER: ANCA Screen: NEGATIVE

## 2021-01-23 LAB — RHEUMATOID FACTOR: Rheumatoid fact SerPl-aCnc: <14 IU/mL (ref <14)

## 2021-01-28 ENCOUNTER — Ambulatory Visit: Payer: BC Managed Care – PPO | Admitting: Dermatology

## 2021-01-28 ENCOUNTER — Encounter: Payer: Self-pay | Admitting: Dermatology

## 2021-01-28 ENCOUNTER — Other Ambulatory Visit: Payer: Self-pay

## 2021-01-28 DIAGNOSIS — D485 Neoplasm of uncertain behavior of skin: Secondary | ICD-10-CM

## 2021-01-28 DIAGNOSIS — D869 Sarcoidosis, unspecified: Secondary | ICD-10-CM | POA: Diagnosis not present

## 2021-01-28 DIAGNOSIS — L308 Other specified dermatitis: Secondary | ICD-10-CM

## 2021-01-28 NOTE — Patient Instructions (Signed)

## 2021-02-03 ENCOUNTER — Telehealth: Payer: Self-pay | Admitting: *Deleted

## 2021-02-03 NOTE — Telephone Encounter (Signed)
Pathology to patient. Patient is seeing other physician for treatment.

## 2021-02-03 NOTE — Telephone Encounter (Signed)
-----   Message from Lavonna Monarch, MD sent at 01/30/2021  7:01 PM EST ----- I discussed with the patient at the time of the biopsy the diagnosis of sarcoidosis.  I believe she is in the process of having her other physicians evaluate this and these studies will determine the appropriate therapy.

## 2021-02-03 NOTE — Telephone Encounter (Signed)
Left message for patient to return our call for pathology results.

## 2021-02-03 NOTE — Telephone Encounter (Signed)
-----   Message from Jennifer Monarch, MD sent at 01/30/2021  7:01 PM EST ----- I discussed with the patient at the time of the biopsy the diagnosis of sarcoidosis.  I believe she is in the process of having her other physicians evaluate this and these studies will determine the appropriate therapy.

## 2021-02-16 NOTE — Progress Notes (Signed)
   New Patient   Subjective  Jennifer Richards is a 55 y.o. female who presents for the following: New Patient (Initial Visit) (Patient here today for rash like lesions on her body x 4 years. Per patient she's had several health issues that she felt may have caused the rash like lesions to form. Patient has had CT scans as well as lab work, patient states that her Physicians years ago thought that she was having this reaction from Xarelto use years ago but since stopping the medication the rash like lesions are still present).  4-year history of nonhealing skin rash consisting of under the skin bumpy areas Location:  Duration:  Quality:  Associated Signs/Symptoms: Modifying Factors:  Severity:  Timing: Context:    The following portions of the chart were reviewed this encounter and updated as appropriate:  Tobacco  Allergies  Meds  Problems  Med Hx  Surg Hx  Fam Hx      Objective  Well appearing patient in no apparent distress; mood and affect are within normal limits. With her complex medical history and several dozen granulomatous macular intranodular lesions this certainly fits a diagnosis of sarcoidosis and tissue confirmation is clearly indicated.  We discussed in detail the enigmatic nature of this disorder along with the need for thorough evaluation including labs, pulmonary function studies, eye examination.  We also discussed potential treatment choices.  Right Forearm - Posterior Multiple areas of papular nodular granulomatous dermal papules strongly suggesting sarcoidosis.  Single punch biopsy obtained from right forearm which I believe will be adequate.               A focused examination was performed including all areas with papular eruption (arms legs torso) plus lymph nodes, lacrimal glands, parotid.. Relevant physical exam findings are noted in the Assessment and Plan.   Assessment & Plan  Neoplasm of uncertain behavior of skin Right Forearm -  Posterior  Skin / nail biopsy Type of biopsy: punch   Informed consent: discussed and consent obtained   Timeout: patient name, date of birth, surgical site, and procedure verified   Procedure prep:  Patient was prepped and draped in usual sterile fashion (Non sterile) Prep type:  Chlorhexidine Anesthesia: the lesion was anesthetized in a standard fashion   Anesthetic:  1% lidocaine w/ epinephrine 1-100,000 local infiltration Punch size:  4 mm Suture size:  4-0 Suture type: nylon   Hemostasis achieved with: suture   Outcome: patient tolerated procedure well   Post-procedure details: sterile dressing applied and wound care instructions given   Dressing type: bandage and petrolatum    Specimen 1 - Surgical pathology Differential Diagnosis: R/O Sarcoidosis - 53mm punch  Check Margins: No  Sarcoidosis  Skin biopsy.  If confirmatory, complete evaluation for systemic sarcoidosis will be done.

## 2021-02-17 ENCOUNTER — Ambulatory Visit: Payer: BC Managed Care – PPO | Admitting: Dermatology

## 2021-02-24 ENCOUNTER — Telehealth: Payer: Self-pay | Admitting: Emergency Medicine

## 2021-02-24 NOTE — Telephone Encounter (Signed)
CD received via mail and placed in Dr. Agustina Caroli B pod box.

## 2021-03-06 ENCOUNTER — Other Ambulatory Visit: Payer: Self-pay

## 2021-03-06 ENCOUNTER — Encounter: Payer: Self-pay | Admitting: Emergency Medicine

## 2021-03-06 ENCOUNTER — Ambulatory Visit: Payer: BC Managed Care – PPO | Admitting: Emergency Medicine

## 2021-03-06 VITALS — BP 112/78 | HR 80 | Temp 98.2°F | Ht 66.0 in | Wt 161.2 lb

## 2021-03-06 DIAGNOSIS — R053 Chronic cough: Secondary | ICD-10-CM | POA: Insufficient documentation

## 2021-03-06 DIAGNOSIS — R9389 Abnormal findings on diagnostic imaging of other specified body structures: Secondary | ICD-10-CM | POA: Diagnosis not present

## 2021-03-06 DIAGNOSIS — D869 Sarcoidosis, unspecified: Secondary | ICD-10-CM | POA: Diagnosis not present

## 2021-03-06 NOTE — Assessment & Plan Note (Signed)
On ACE inhibitor but consider also obstructive lung disease.  Has been treated for possible asthma in the past.  Need to quantify her degree of obstruction.  We will talk to Dr. Marlou Sa about possibly changing her benazepril/HCTZ to an alternative that is not associated with coughing Keep your Xopenex available to use 2 puffs up to every 6 hours if needed for shortness of breath, coughing.   We may decide to perform pulmonary function testing at some point going forward to better evaluate asthma

## 2021-03-06 NOTE — Assessment & Plan Note (Signed)
Based on CT findings and her new skin biopsy, this is all consistent with sarcoidosis.  We will plan to repeat her CT at the 14-month mark to ensure interval stability.  If stable then we will space out her follow-up.

## 2021-03-06 NOTE — Assessment & Plan Note (Addendum)
New diagnosis.  This is likely a unifying diagnosis, will explain her skin findings and also her chest findings.  ACE level is less than 5, no clear evidence for active disease although she does still have rash and is using steroid cream on this.  Surveillance PFT and CT chest as above.  She needs an ophthalmology exam annually

## 2021-03-06 NOTE — Patient Instructions (Addendum)
We will repeat your CT scan of the chest in mid January 2023 to follow sarcoidosis. We will perform pulmonary function testing in January at your next office visit. Keep your Xopenex available to use 2 puffs when you need it for shortness of breath, chest tightness, wheezing. Continue your loratadine 10 mg once daily. Continue your Lotensin (benazepril/HCTZ) for now.  We could consider changing it to an alternative if you have flaring cough symptoms. Follow Dr. Lamonte Sakai in January to review your testing.  Call if you have any problems sooner.

## 2021-03-06 NOTE — Assessment & Plan Note (Signed)
Suspect some component of upper airway irritation from her rhinitis.  She is on benazepril/HCTZ but seems to be overall tolerating.  Low threshold to change this going forward.  Need to evaluate for obstructive lung disease as she does use and benefit from [place].  We will perform pulmonary function testing next visit.

## 2021-03-06 NOTE — Assessment & Plan Note (Signed)
We will work on getting copies of her CT imaging.  Currently showed irregular bilateral pulmonary nodular disease and some mediastinal and hilar lymphadenopathy, suspicious for sarcoidosis especially in light of her rash.  Check serologies We will perform blood work today (ANA, ANCA, repeat ACE level, Sjogren panel, RF) We will refer you to dermatology to have your rash evaluated and biopsied Follow with Dr. Lamonte Sakai next available so we can review your blood work, your CT scan and decide which other work-up will be helpful.

## 2021-03-06 NOTE — Progress Notes (Signed)
Subjective:    Patient ID: Jennifer Richards, female    DOB: June 17, 1965, 55 y.o.   MRN: [id number]  HPI 55 year old never smoker with history of pulmonary embolism (2003), mitral valve prolapse diabetes, hypertension, third-degree heart block post pacemaker, with abandoned RV lead.  Underwent pacemaker replacement to dual-chamber 11/2020.  Also with a history of SDH.  Has been given a diagnosis of asthma in past, has been on QVAR but not not currently, xopenex, loratadine. Describes a significant improvement in her energy level when she had the pacer replaced. She has some brown raised patchy rash on her arms, chest, legs, these go back as fare as 2018. Her principle sx are cough, dyspnea. Has some benefit from albuterol.   Underwent CT chest at [facility] 01/08/21, report available to me but no images at this time.  Apparently showed a irregular scattered bilateral pulmonary nodules, new compared with 01/12/2017 CT abdomen, with irregular margins, largest 12 mm in the left lower lobe, no cavitation.  Bilateral mediastinal and hilar lymph adenopathy present.  Multiple small low-attenuation hepatic lesions, also new.  Based on review of her notes from Dr. Marlou Sa she has a positive ANA.  An ACE level was obtained by Dr Marlou Sa 10/10 was < 5.    ROV 03/06/21 --Mr. Alvester Chou is a never smoker with a history of PE, diabetes, hypertension, pacemaker for third-degree heart block, possible asthma.  I saw her in early November for obstructive lung disease, chronic cough and possible sarcoidosis.  CT scan with lymphadenopathy and pulmonary nodular disease as above.  She is also having skin changes that were characteristic, now biopsied as below.  We discussed possibly changing her ACE inhibitor, did not yet order PFT. She uses xopenex about once a week. She remains on loratadine, benazepril.   She underwent a skin biopsy by Dr. Carlis Abbott on 11/8 >> findings consistent with sarcoidosis.  Serologies 01/21/2021 >> ANA positive (1:  80), SSA 3.1 (positive) ACE level negative, ANCA negative  Review of Systems As per HPI  Past Medical History:  Diagnosis Date   Anticoagulated on Coumadin    Asthma    Asthmatic bronchitis    Diabetes mellitus without complication (HCC)    Hx of pulmonary embolus    Hypertension    Obesity (BMI 30.0-34.9)    Pacemaker    Sinus arrest      Family History  Problem Relation Age of Onset   Diabetes Mother      Social History   Socioeconomic History   Marital status: Married    Spouse name: Not on file   Number of children: Not on file   Years of education: Not on file   Highest education level: Not on file  Occupational History   Not on file  Tobacco Use   Smoking status: Never   Smokeless tobacco: Never  Vaping Use   Vaping Use: Never used  Substance and Sexual Activity   Alcohol use: Yes    Comment: occasional   Drug use: Never   Sexual activity: Yes    Partners: Male    Comment: 1st intercourse- 12, partners- 17, married- 8 yrs  Other Topics Concern   Not on file  Social History Narrative   ** Merged History Encounter **       Social Determinants of Health   Financial Resource Strain: Not on file  Food Insecurity: Not on file  Transportation Needs: Not on file  Physical Activity: Not on file  Stress: Not on file  Social Connections: Not on file  Intimate Partner Violence: Not on file      Allergies  Allergen Reactions   Xarelto [Rivaroxaban] Other (See Comments)     Loss of smell, weight loss and eye problems     Outpatient Medications Prior to Visit  Medication Sig Dispense Refill   benazepril-hydrochlorthiazide (LOTENSIN HCT) 20-25 MG per tablet Take 0.5 tablets by mouth every other day.     CINNAMON PO Take 2 capsules by mouth daily.     clindamycin (CLINDAGEL) 1 % gel APPLY TO AFFECTED AREA TWICE A DAY     ferrous gluconate (FERGON) 324 MG tablet Take 1 tablet by mouth 2 (two) times daily.     levalbuterol (XOPENEX HFA) 45 MCG/ACT inhaler  ONE PUFF THREE TIMES A DAY AS NEEDED FOR WHEEZING     loratadine (CLARITIN) 10 MG tablet Take 10 mg by mouth daily.     meclizine (ANTIVERT) 12.5 MG tablet Take 12.5-25 mg by mouth 2 (two) times daily as needed.     metFORMIN (GLUCOPHAGE) 850 MG tablet Take 850 mg by mouth 2 (two) times daily with a meal.     Multiple Vitamin (MULTIVITAMIN WITH MINERALS) TABS tablet Take 1 tablet by mouth daily.     Olopatadine HCl 0.2 % SOLN 1 drop into affected eye     Omega-3 Fatty Acids (FISH OIL) 1000 MG CAPS Take 1 capsule by mouth daily.     Potassium 99 MG TABS Take 1 tablet by mouth daily.     rosuvastatin (CRESTOR) 10 MG tablet Take 10 mg by mouth daily.     Vitamin D, Cholecalciferol, 25 MCG (1000 UT) CAPS Take 2,000 Units by mouth daily.     azithromycin (ZITHROMAX) 250 MG tablet Take by mouth.     beclomethasone (QVAR) 80 MCG/ACT inhaler Inhale 2 puffs into the lungs 2 (two) times daily.     No facility-administered medications prior to visit.         Objective:   Physical Exam Vitals:   03/06/21 1204  BP: 112/78  Pulse: 80  Temp: 98.2 F (36.8 C)  TempSrc: Oral  SpO2: 100%  Weight: 161 lb 3.2 oz (73.1 kg)  Height: 5\' 6"  (1.676 m)    Gen: Pleasant, well-nourished, in no distress,  normal affect  ENT: No lesions,  mouth clear,  oropharynx clear, no postnasal drip  Neck: No JVD, no stridor  Lungs: No use of accessory muscles, no crackles or wheezing on normal respiration, no wheeze on forced expiration  Cardiovascular: RRR, heart sounds normal, no murmur or gallops, no peripheral edema  Musculoskeletal: No deformities, no cyanosis or clubbing  Neuro: alert, awake, non focal  Skin: Warm, hyperpigmented round flat rash on arms legs torso of various ages      Assessment & Plan:  Abnormal CT of the chest Based on CT findings and her new skin biopsy, this is all consistent with sarcoidosis.  We will plan to repeat her CT at the 90-month mark to ensure interval stability.  If  stable then we will space out her follow-up.  Chronic cough Suspect some component of upper airway irritation from her rhinitis.  She is on benazepril/HCTZ but seems to be overall tolerating.  Low threshold to change this going forward.  Need to evaluate for obstructive lung disease as she does use and benefit from [facility].  We will perform pulmonary function testing next visit.  Sarcoidosis New diagnosis.  This is likely a unifying diagnosis, will explain her  skin findings and also her chest findings.  ACE level is less than 5, no clear evidence for active disease although she does still have rash and is using steroid cream on this.  Surveillance PFT and CT chest as above.  She needs an ophthalmology exam annually   Baltazar Apo, MD, PhD 03/06/2021, 12:25 PM [facility] [phone] or if no answer before 7:00PM call [phone] For any issues after 7:00PM please call eLink [phone]

## 2021-04-09 ENCOUNTER — Other Ambulatory Visit: Payer: Self-pay

## 2021-04-09 ENCOUNTER — Ambulatory Visit (INDEPENDENT_AMBULATORY_CARE_PROVIDER_SITE_OTHER): Payer: BC Managed Care – PPO | Admitting: Emergency Medicine

## 2021-04-09 ENCOUNTER — Ambulatory Visit (INDEPENDENT_AMBULATORY_CARE_PROVIDER_SITE_OTHER)
Admission: RE | Admit: 2021-04-09 | Discharge: 2021-04-09 | Disposition: A | Discharge status: Home / Self Care | Payer: BC Managed Care – PPO | Source: Ambulatory Visit | Attending: Emergency Medicine | Admitting: Emergency Medicine

## 2021-04-09 DIAGNOSIS — D869 Sarcoidosis, unspecified: Secondary | ICD-10-CM

## 2021-04-09 LAB — PULMONARY FUNCTION TEST
DL/VA % pred: 92 %
DL/VA: 3.87 ml/min/mmHg/L
DLCO cor % pred: 77 %
DLCO cor: 16.69 ml/min/mmHg
DLCO unc % pred: 77 %
DLCO unc: 16.69 ml/min/mmHg
FEF 25-75 Post: 1.78 L/sec
FEF 25-75 Pre: 1.22 L/sec
FEF2575-%Change-Post: 46 %
FEF2575-%Pred-Post: 76 %
FEF2575-%Pred-Pre: 52 %
FEV1-%Change-Post: 11 %
FEV1-%Pred-Post: 91 %
FEV1-%Pred-Pre: 81 %
FEV1-Post: 2.12 L
FEV1-Pre: 1.9 L
FEV1FVC-%Change-Post: 4 %
FEV1FVC-%Pred-Pre: 86 %
FEV6-%Change-Post: 6 %
FEV6-%Pred-Post: 102 %
FEV6-%Pred-Pre: 95 %
FEV6-Post: 2.9 L
FEV6-Pre: 2.72 L
FEV6FVC-%Change-Post: 0 %
FEV6FVC-%Pred-Post: 103 %
FEV6FVC-%Pred-Pre: 102 %
FVC-%Change-Post: 6 %
FVC-%Pred-Post: 99 %
FVC-%Pred-Pre: 93 %
FVC-Post: 2.92 L
FVC-Pre: 2.73 L
Post FEV1/FVC ratio: 73 %
Post FEV6/FVC ratio: 100 %
Pre FEV1/FVC ratio: 70 %
Pre FEV6/FVC Ratio: 100 %
RV % pred: 90 %
RV: 1.8 L
TLC % pred: 89 %
TLC: 4.73 L

## 2021-04-09 NOTE — Progress Notes (Signed)
PFT done today. 

## 2021-04-16 ENCOUNTER — Other Ambulatory Visit: Payer: Self-pay

## 2021-04-16 ENCOUNTER — Encounter: Payer: Self-pay | Admitting: Emergency Medicine

## 2021-04-16 ENCOUNTER — Ambulatory Visit (INDEPENDENT_AMBULATORY_CARE_PROVIDER_SITE_OTHER): Payer: BC Managed Care – PPO | Admitting: Emergency Medicine

## 2021-04-16 VITALS — BP 126/80 | HR 84 | Temp 97.9°F | Ht 65.0 in | Wt 163.8 lb

## 2021-04-16 DIAGNOSIS — R053 Chronic cough: Secondary | ICD-10-CM | POA: Diagnosis not present

## 2021-04-16 DIAGNOSIS — R911 Solitary pulmonary nodule: Secondary | ICD-10-CM

## 2021-04-16 DIAGNOSIS — D869 Sarcoidosis, unspecified: Secondary | ICD-10-CM

## 2021-04-16 NOTE — Assessment & Plan Note (Signed)
Pulmonary nodular disease, looks slightly larger to me on her most recent CT from 04/09/2021 compared with her CT abdomen from 12/2020.  The distribution is similar.  She may merit treatment with prednisone as we go forward.  For now since she has minimal symptoms we will plan to follow a repeat CT.  We will make a determination about starting prednisone depending on radiographical progression, clinical progression.  Repeat CT in April 2023

## 2021-04-16 NOTE — Patient Instructions (Addendum)
Would recommend that we change your benazepril/HCTZ to an alternative BP medication. Please talk to Dr Marlou Sa about this.  We will hold off on starting scheduled inhaler medication right now.  We may decide to do so depending on how your cough and other symptoms are doing after you are off the benazepril Keep your Xopenex available to use 2 puffs up to every 4 hours if needed for shortness of breath, chest tightness, wheezing, coughing. We will plan to repeat your CT scan of the chest in April 2023. Follow Dr. Lamonte Sakai in April after your CT so we can review the results together.  Depending on those results we will decide whether treating with prednisone is indicated.

## 2021-04-16 NOTE — Progress Notes (Signed)
.  Subjective:    Patient ID: Jennifer Richards, female    DOB: January 28, 1966, 56 y.o.   MRN: [id number]  HPI 56 year old never smoker with history of pulmonary embolism (2003), mitral valve prolapse diabetes, hypertension, third-degree heart block post pacemaker, with abandoned RV lead.  Underwent pacemaker replacement to dual-chamber 11/2020.  Also with a history of SDH.  Has been given a diagnosis of asthma in past, has been on QVAR but not not currently, xopenex, loratadine. Describes a significant improvement in her energy level when she had the pacer replaced. She has some brown raised patchy rash on her arms, chest, legs, these go back as fare as 2018. Her principle sx are cough, dyspnea. Has some benefit from albuterol.   Underwent CT chest at [facility] 01/08/21, report available to me but no images at this time.  Apparently showed a irregular scattered bilateral pulmonary nodules, new compared with 01/12/2017 CT abdomen, with irregular margins, largest 12 mm in the left lower lobe, no cavitation.  Bilateral mediastinal and hilar lymph adenopathy present.  Multiple small low-attenuation hepatic lesions, also new.  Based on review of her notes from Dr. Marlou Sa she has a positive ANA.  An ACE level was obtained by Dr Marlou Sa 10/10 was < 5.    ROV 03/06/21 --Mr. [name] is a never smoker with a history of PE, diabetes, hypertension, pacemaker for third-degree heart block, possible asthma.  I saw her in early November for obstructive lung disease, chronic cough and possible sarcoidosis.  CT scan with lymphadenopathy and pulmonary nodular disease as above.  She is also having skin changes that were characteristic, now biopsied as below.  We discussed possibly changing her ACE inhibitor, did not yet order PFT. She uses xopenex about once a week. She remains on loratadine, benazepril.   She underwent a skin biopsy by Dr. Carlis Abbott on 11/8 >> findings consistent with sarcoidosis.  Serologies 01/21/2021 >> ANA positive  (1: 80), SSA 3.1 (positive) ACE level negative, ANCA negative   ROV 04/16/21 --pleasant 56 year old woman, never smoker with a history of PE, MVP, diabetes, hypertension, pacemaker for third-degree heart block and remote SDH.  I have seen her for a new diagnosis of sarcoidosis (Ultimately diagnosed on skin biopsy 01/2022) and obstructive lung disease, associated chronic cough.  She has some scattered bilateral pulmonary nodular disease, mediastinal and hilar adenopathy on CT scan of the chest.  She uses xopenex 0-3x a day, sometimes with temperature changes. Often leads to cough. Occasionally SOB.   PFTs performed on 04/09/2021 and reviewed by me, show mild obstruction with a borderline bronchodilator response, normal lung volumes, decrease diffusion capacity that corrects to the normal range when adjusted for her alveolar volume.  CT chest 04/09/2021 reviewed by me, shows mediastinal and hilar lymphadenopathy with a 1.3 subcarinal node, numerous peribronchovascular perilymphatic irregular nodules bilaterally, largest in the right middle lobe 1.7 x 0.9 cm.  By comparison to CT abdomen from October 2022 from [place] I believe the nodules are slightly larger.   Review of Systems As per HPI  Past Medical History:  Diagnosis Date   Anticoagulated on Coumadin    Asthma    Asthmatic bronchitis    Diabetes mellitus without complication (HCC)    Hx of pulmonary embolus    Hypertension    Obesity (BMI 30.0-34.9)    Pacemaker    Sinus arrest      Family History  Problem Relation Age of Onset   Diabetes Mother      Social History  Socioeconomic History   Marital status: Married    Spouse name: Not on file   Number of children: Not on file   Years of education: Not on file   Highest education level: Not on file  Occupational History   Not on file  Tobacco Use   Smoking status: Never   Smokeless tobacco: Never  Vaping Use   Vaping Use: Never used  Substance and Sexual Activity    Alcohol use: Yes    Comment: occasional   Drug use: Never   Sexual activity: Yes    Partners: Male    Comment: 1st intercourse- 61, partners- 29, married- 8 yrs  Other Topics Concern   Not on file  Social History Narrative   ** Merged History Encounter **       Social Determinants of Health   Financial Resource Strain: Not on file  Food Insecurity: Not on file  Transportation Needs: Not on file  Physical Activity: Not on file  Stress: Not on file  Social Connections: Not on file  Intimate Partner Violence: Not on file      Allergies  Allergen Reactions   Xarelto [Rivaroxaban] Other (See Comments)     Loss of smell, weight loss and eye problems     Outpatient Medications Prior to Visit  Medication Sig Dispense Refill   benazepril-hydrochlorthiazide (LOTENSIN HCT) 20-25 MG per tablet Take 0.5 tablets by mouth every other day.     CINNAMON PO Take 2 capsules by mouth daily.     clindamycin (CLINDAGEL) 1 % gel APPLY TO AFFECTED AREA TWICE A DAY     ferrous gluconate (FERGON) 324 MG tablet Take 1 tablet by mouth 2 (two) times daily.     levalbuterol (XOPENEX HFA) 45 MCG/ACT inhaler ONE PUFF THREE TIMES A DAY AS NEEDED FOR WHEEZING     loratadine (CLARITIN) 10 MG tablet Take 10 mg by mouth daily.     meclizine (ANTIVERT) 12.5 MG tablet Take 12.5-25 mg by mouth 2 (two) times daily as needed.     metFORMIN (GLUCOPHAGE) 850 MG tablet Take 850 mg by mouth 2 (two) times daily with a meal.     Multiple Vitamin (MULTIVITAMIN WITH MINERALS) TABS tablet Take 1 tablet by mouth daily.     Olopatadine HCl 0.2 % SOLN 1 drop into affected eye     Omega-3 Fatty Acids (FISH OIL) 1000 MG CAPS Take 1 capsule by mouth daily.     Potassium 99 MG TABS Take 1 tablet by mouth daily.     rosuvastatin (CRESTOR) 10 MG tablet Take 10 mg by mouth daily.     Vitamin D, Cholecalciferol, 25 MCG (1000 UT) CAPS Take 2,000 Units by mouth daily.     No facility-administered medications prior to visit.          Objective:   Physical Exam Vitals:   04/16/21 1011  BP: 126/80  Pulse: 84  Temp: 97.9 F (36.6 C)  TempSrc: Oral  SpO2: 99%  Weight: 163 lb 12.8 oz (74.3 kg)  Height: 5\' 5"  (1.651 m)    Gen: Pleasant, well-nourished, in no distress,  normal affect  ENT: No lesions,  mouth clear,  oropharynx clear, no postnasal drip  Neck: No JVD, no stridor  Lungs: No use of accessory muscles, no crackles or wheezing on normal respiration, no wheeze on forced expiration  Cardiovascular: RRR, heart sounds normal, no murmur or gallops, no peripheral edema  Musculoskeletal: No deformities, no cyanosis or clubbing  Neuro: alert, awake,  non focal  Skin: Warm, hyperpigmented round flat rash on arms legs torso of various ages      Assessment & Plan:  Chronic cough Continues to have some cough.  Recommend that she discuss with Dr. Marlou Sa possibly changing her benazepril/HCTZ to an alternative, probably an ARB/HCTZ.  She is going to do so.  If she continues to have cough then it would be reasonable to consider starting an ICS.  I will hold off for now.  She can keep Xopenex available to use if needed.  Unclear how much the cough correlates with the pulmonary nodular disease but could represent active sarcoid.  Again if she continues to have symptoms we would consider treating with an extended course of prednisone   Sarcoidosis Pulmonary nodular disease, looks slightly larger to me on her most recent CT from 04/09/2021 compared with her CT abdomen from 12/2020.  The distribution is similar.  She may merit treatment with prednisone as we go forward.  For now since she has minimal symptoms we will plan to follow a repeat CT.  We will make a determination about starting prednisone depending on radiographical progression, clinical progression.  Repeat CT in April 2023    Baltazar Apo, MD, PhD 04/16/2021, 1:51 PM [facility] [phone] or if no answer before 7:00PM  call [phone] For any issues after 7:00PM please call eLink [phone]

## 2021-04-16 NOTE — Assessment & Plan Note (Signed)
Continues to have some cough.  Recommend that she discuss with Dr. Marlou Sa possibly changing her benazepril/HCTZ to an alternative, probably an ARB/HCTZ.  She is going to do so.  If she continues to have cough then it would be reasonable to consider starting an ICS.  I will hold off for now.  She can keep Xopenex available to use if needed.  Unclear how much the cough correlates with the pulmonary nodular disease but could represent active sarcoid.  Again if she continues to have symptoms we would consider treating with an extended course of prednisone

## 2021-06-27 ENCOUNTER — Ambulatory Visit (INDEPENDENT_AMBULATORY_CARE_PROVIDER_SITE_OTHER)
Admission: RE | Admit: 2021-06-27 | Discharge: 2021-06-27 | Disposition: A | Discharge status: Home / Self Care | Payer: BC Managed Care – PPO | Source: Ambulatory Visit | Attending: Emergency Medicine | Admitting: Emergency Medicine

## 2021-06-27 DIAGNOSIS — R911 Solitary pulmonary nodule: Secondary | ICD-10-CM | POA: Diagnosis not present

## 2022-03-13 ENCOUNTER — Other Ambulatory Visit: Payer: Self-pay | Admitting: Internal Medicine

## 2022-03-13 ENCOUNTER — Ambulatory Visit
Admission: RE | Admit: 2022-03-13 | Discharge: 2022-03-13 | Disposition: A | Discharge status: Home / Self Care | Payer: BC Managed Care – PPO | Source: Ambulatory Visit | Attending: Internal Medicine | Admitting: Internal Medicine

## 2022-03-13 DIAGNOSIS — M25522 Pain in left elbow: Secondary | ICD-10-CM

## 2022-04-27 ENCOUNTER — Telehealth (HOSPITAL_COMMUNITY): Payer: Self-pay

## 2022-04-27 NOTE — Telephone Encounter (Signed)
Called and spoke with pt in regards to CR, pt stated she works in [place], [place] and only will can do rehab during her lunch time. Adv pt I can fax her referral to [facility] in [place], [place].   Closed referral

## 2022-07-09 ENCOUNTER — Emergency Department (HOSPITAL_BASED_OUTPATIENT_CLINIC_OR_DEPARTMENT_OTHER): Payer: BC Managed Care – PPO

## 2022-07-09 ENCOUNTER — Emergency Department (HOSPITAL_BASED_OUTPATIENT_CLINIC_OR_DEPARTMENT_OTHER): Payer: BC Managed Care – PPO | Admitting: Radiology

## 2022-07-09 ENCOUNTER — Other Ambulatory Visit: Payer: Self-pay

## 2022-07-09 ENCOUNTER — Emergency Department (HOSPITAL_BASED_OUTPATIENT_CLINIC_OR_DEPARTMENT_OTHER)
Admission: EM | Admit: 2022-07-09 | Discharge: 2022-07-09 | Disposition: A | Discharge status: Home / Self Care | Payer: BC Managed Care – PPO | Attending: Emergency Medicine | Admitting: Emergency Medicine

## 2022-07-09 ENCOUNTER — Encounter (HOSPITAL_BASED_OUTPATIENT_CLINIC_OR_DEPARTMENT_OTHER): Payer: Self-pay | Admitting: Emergency Medicine

## 2022-07-09 DIAGNOSIS — R197 Diarrhea, unspecified: Secondary | ICD-10-CM | POA: Diagnosis not present

## 2022-07-09 DIAGNOSIS — J45909 Unspecified asthma, uncomplicated: Secondary | ICD-10-CM | POA: Insufficient documentation

## 2022-07-09 DIAGNOSIS — Z79899 Other long term (current) drug therapy: Secondary | ICD-10-CM | POA: Diagnosis not present

## 2022-07-09 DIAGNOSIS — Z950 Presence of cardiac pacemaker: Secondary | ICD-10-CM | POA: Diagnosis not present

## 2022-07-09 DIAGNOSIS — I10 Essential (primary) hypertension: Secondary | ICD-10-CM | POA: Insufficient documentation

## 2022-07-09 DIAGNOSIS — R55 Syncope and collapse: Secondary | ICD-10-CM | POA: Insufficient documentation

## 2022-07-09 DIAGNOSIS — Z7951 Long term (current) use of inhaled steroids: Secondary | ICD-10-CM | POA: Insufficient documentation

## 2022-07-09 DIAGNOSIS — Z7901 Long term (current) use of anticoagulants: Secondary | ICD-10-CM | POA: Insufficient documentation

## 2022-07-09 HISTORY — DX: Sarcoidosis, unspecified: D86.9

## 2022-07-09 LAB — URINALYSIS, ROUTINE W REFLEX MICROSCOPIC
Bilirubin Urine: NEGATIVE
Glucose, UA: >=500 mg/dL — AB
Hgb urine dipstick: NEGATIVE
Ketones, ur: NEGATIVE mg/dL
Leukocytes,Ua: NEGATIVE
Nitrite: NEGATIVE
Protein, ur: NEGATIVE mg/dL
Specific Gravity, Urine: 1.01 (ref 1.005–1.030)
pH: 6 (ref 5.0–8.0)

## 2022-07-09 LAB — CBC WITH DIFFERENTIAL/PLATELET
Abs Immature Granulocytes: 0.01 10*3/uL (ref 0.00–0.07)
Basophils Absolute: 0 10*3/uL (ref 0.0–0.1)
Basophils Relative: 0 %
Eosinophils Absolute: 0.1 10*3/uL (ref 0.0–0.5)
Eosinophils Relative: 3 %
HCT: 33.5 % — ABNORMAL LOW (ref 36.0–46.0)
Hemoglobin: 10.8 g/dL — ABNORMAL LOW (ref 12.0–15.0)
Immature Granulocytes: 0 %
Lymphocytes Relative: 13 %
Lymphs Abs: 0.5 10*3/uL — ABNORMAL LOW (ref 0.7–4.0)
MCH: 28.1 pg (ref 26.0–34.0)
MCHC: 32.2 g/dL (ref 30.0–36.0)
MCV: 87 fL (ref 80.0–100.0)
Monocytes Absolute: 0.3 10*3/uL (ref 0.1–1.0)
Monocytes Relative: 8 %
Neutro Abs: 2.8 10*3/uL (ref 1.7–7.7)
Neutrophils Relative %: 76 %
Platelets: 309 10*3/uL (ref 150–400)
RBC: 3.85 MIL/uL — ABNORMAL LOW (ref 3.87–5.11)
RDW: 14.7 % (ref 11.5–15.5)
WBC: 3.7 10*3/uL — ABNORMAL LOW (ref 4.0–10.5)
nRBC: 0 % (ref 0.0–0.2)

## 2022-07-09 LAB — COMPREHENSIVE METABOLIC PANEL
ALT: 23 U/L (ref 0–44)
AST: 16 U/L (ref 15–41)
Albumin: 4.5 g/dL (ref 3.5–5.0)
Alkaline Phosphatase: 104 U/L (ref 38–126)
Anion gap: 8 (ref 5–15)
BUN: 9 mg/dL (ref 6–20)
CO2: 24 mmol/L (ref 22–32)
Calcium: 10.2 mg/dL (ref 8.9–10.3)
Chloride: 106 mmol/L (ref 98–111)
Creatinine, Ser: 0.78 mg/dL (ref 0.44–1.00)
GFR, Estimated: >60 mL/min (ref >60)
Glucose, Bld: 114 mg/dL — ABNORMAL HIGH (ref 70–99)
Potassium: 3.5 mmol/L (ref 3.5–5.1)
Sodium: 138 mmol/L (ref 135–145)
Total Bilirubin: 0.5 mg/dL (ref 0.3–1.2)
Total Protein: 6.8 g/dL (ref 6.5–8.1)

## 2022-07-09 LAB — URINALYSIS, MICROSCOPIC (REFLEX)

## 2022-07-09 LAB — TROPONIN I (HIGH SENSITIVITY)
Troponin I (High Sensitivity): 2 ng/L (ref <18)
Troponin I (High Sensitivity): 3 ng/L (ref <18)

## 2022-07-09 LAB — TSH: TSH: 0.997 u[IU]/mL (ref 0.350–4.500)

## 2022-07-09 LAB — BRAIN NATRIURETIC PEPTIDE: B Natriuretic Peptide: 28.8 pg/mL (ref 0.0–100.0)

## 2022-07-09 LAB — MAGNESIUM: Magnesium: 1.6 mg/dL — ABNORMAL LOW (ref 1.7–2.4)

## 2022-07-09 MED ORDER — SODIUM CHLORIDE 0.9 % IV BOLUS
1000.0000 mL | Freq: Once | INTRAVENOUS | Status: AC
  Administered 2022-07-09: 1000 mL via INTRAVENOUS

## 2022-07-09 NOTE — ED Triage Notes (Signed)
Pt has medtronic pacemaker in her stomach. Pt had steroid shot for tendonitis few weeks ago.

## 2022-07-09 NOTE — Discharge Instructions (Signed)
Your history, exam, workup today are suggestive of dehydration from the diarrhea leading to the syncopal episode.  We interrogated her pacemaker and there were no arrhythmias or significant problems seen.  You did not have any preceding palpitations, chest pain, or shortness of breath and we have a low suspicion this is a cardiac cause of syncope more likely the persistent diarrhea led to dehydration and lightheadedness/passing out.  We gave you some fluids and you appeared to improve.  Your other workup was overall reassuring.  We discussed contacting cardiology but given your reassuring workup, we feel safe with you being discharged to follow-up with them and call your regular doctor.  Please rest and stay hydrated.  If any symptoms change or worsen acutely, please return to the nearest emergency department.

## 2022-07-09 NOTE — ED Notes (Signed)
Dc instructions reviewed with patient. Patient voiced understanding. Dc with belongings.  °

## 2022-07-09 NOTE — ED Triage Notes (Signed)
Pt has had a pacemaker since 1988. In 2022 pt had syncopal episode,changed pacemaker to dual chamber (leads were displaced on old one). In 2023 ,sept went in for plan for defib/pacemaker,but vein ruptured, had to open her up and only placed a dual chamber pacemaker in the stomach.   Pt awoke 3 am , had to use bathroom ( diarrhea which she has had since starting new medication), pt felt dizzy , next thing she is being helped off the floor. Has hematoma to back of head.   New med is mycophenolate

## 2022-07-09 NOTE — ED Triage Notes (Signed)
Pt is on eliquis 

## 2022-07-09 NOTE — ED Provider Notes (Addendum)
EMERGENCY DEPARTMENT AT [facility] Provider Note   CSN: [id number] Arrival date & time: 07/09/22  1202     History  No chief complaint on file.   Jennifer Richards is a 57 y.o. female.  The history is provided by the patient and medical records. No language interpreter was used.  Loss of Consciousness Episode history:  Single Most recent episode:  Today Duration:  2 seconds Timing:  Unable to specify Progression:  Resolved Chronicity:  New Context: standing up   Witnessed: no   Relieved by:  Nothing Worsened by:  Nothing Ineffective treatments:  None tried Associated symptoms: no chest pain, no confusion, no diaphoresis, no difficulty breathing, no dizziness, no fever, no headaches, no malaise/fatigue, no nausea, no palpitations, no recent fall, no recent injury, no shortness of breath, no vomiting and no weakness        Home Medications Prior to Admission medications   Medication Sig Start Date End Date Taking? Authorizing Provider  folic acid (FOLVITE) 1 MG tablet Take 1 mg by mouth daily. 08/14/21 08/14/22 Yes [provider]  JARDIANCE 10 MG TABS tablet Take 10 mg by mouth daily with breakfast. 06/10/22  Yes [provider]  metoprolol succinate (TOPROL-XL) 25 MG 24 hr tablet Take 25 mg by mouth daily. 08/13/21 08/13/22 Yes [provider]  mycophenolate (CELLCEPT) 500 MG tablet Take 1,000 mg by mouth 2 (two) times daily. 06/12/22 10/10/22 Yes [provider]  spironolactone (ALDACTONE) 25 MG tablet Take 12.5 mg by mouth once. 06/10/22 06/10/23 Yes [provider]  benazepril-hydrochlorthiazide (LOTENSIN HCT) 20-25 MG per tablet Take 0.5 tablets by mouth every other day.    [provider]  CINNAMON PO Take 2 capsules by mouth daily.    [provider]  clindamycin (CLINDAGEL) 1 % gel APPLY TO AFFECTED AREA TWICE A DAY    [provider]  ELIQUIS 5 MG TABS tablet Take 5 mg by mouth 2 (two)  times daily. Every 12 hours    [provider]  ferrous gluconate (FERGON) 324 MG tablet Take 1 tablet by mouth 2 (two) times daily.    [provider]  levalbuterol Pauline Aus HFA) 45 MCG/ACT inhaler ONE PUFF THREE TIMES A DAY AS NEEDED FOR WHEEZING    [provider]  loratadine (CLARITIN) 10 MG tablet Take 10 mg by mouth daily.    [provider]  losartan (COZAAR) 25 MG tablet Take 25 mg by mouth daily.    [provider]  meclizine (ANTIVERT) 12.5 MG tablet Take 12.5-25 mg by mouth 2 (two) times daily as needed. 12/30/20   [provider]  metFORMIN (GLUCOPHAGE) 850 MG tablet Take 850 mg by mouth 2 (two) times daily with a meal.    [provider]  Multiple Vitamin (MULTIVITAMIN WITH MINERALS) TABS tablet Take 1 tablet by mouth daily.    [provider]  Olopatadine HCl 0.2 % SOLN 1 drop into affected eye    [provider]  Omega-3 Fatty Acids (FISH OIL) 1000 MG CAPS Take 1 capsule by mouth daily.    [provider]  Potassium 99 MG TABS Take 1 tablet by mouth daily.    [provider]  rosuvastatin (CRESTOR) 10 MG tablet Take 10 mg by mouth daily.    [provider]  Vitamin D, Cholecalciferol, 25 MCG (1000 UT) CAPS Take 2,000 Units by mouth daily.    [provider]      Allergies  Xarelto [rivaroxaban]    Review of Systems   Review of Systems  Constitutional:  Negative for chills, diaphoresis, fatigue, fever and malaise/fatigue.  HENT:  Negative for congestion.   Eyes:  Negative for visual disturbance.  Respiratory:  Negative for cough, chest tightness, shortness of breath and wheezing.   Cardiovascular:  Positive for syncope. Negative for chest pain and palpitations.  Gastrointestinal:  Positive for diarrhea. Negative for abdominal pain, constipation, nausea and vomiting.  Genitourinary:  Negative for dysuria.  Musculoskeletal:  Negative for back pain, neck pain  and neck stiffness.  Skin:  Negative for rash and wound.  Neurological:  Positive for light-headedness (resolved now). Negative for dizziness, weakness, numbness and headaches.  Psychiatric/Behavioral:  Negative for agitation and confusion.   All other systems reviewed and are negative.   Physical Exam Updated Vital Signs BP 103/60 (BP Location: Right Arm)   Pulse 93   Temp 97.6 F (36.4 C) (Oral)   Resp 19   Ht  (1.651 m)   Wt 77.1 kg   LMP  (LMP Unknown)   SpO2 99%   BMI 28.29 kg/m  Physical Exam Vitals and nursing note reviewed.  Constitutional:      General: She is not in acute distress.    Appearance: She is well-developed. She is not ill-appearing, toxic-appearing or diaphoretic.  HENT:     Head: Normocephalic and atraumatic.     Nose: No congestion or rhinorrhea.     Mouth/Throat:     Mouth: Mucous membranes are dry.     Pharynx: No oropharyngeal exudate or posterior oropharyngeal erythema.  Eyes:     Extraocular Movements: Extraocular movements intact.     Conjunctiva/sclera: Conjunctivae normal.     Pupils: Pupils are equal, round, and reactive to light.  Cardiovascular:     Rate and Rhythm: Normal rate and regular rhythm.     Heart sounds: No murmur heard. Pulmonary:     Effort: Pulmonary effort is normal. No respiratory distress.     Breath sounds: Normal breath sounds. No wheezing, rhonchi or rales.  Chest:     Chest wall: No tenderness.  Abdominal:     General: Abdomen is flat.     Palpations: Abdomen is soft.     Tenderness: There is no abdominal tenderness. There is no right CVA tenderness, left CVA tenderness, guarding or rebound.  Musculoskeletal:        General: No swelling or tenderness.     Cervical back: Neck supple. No tenderness.  Skin:    General: Skin is warm and dry.     Capillary Refill: Capillary refill takes less than 2 seconds.     Findings: No erythema.  Neurological:     General: No focal deficit present.     Mental Status:  She is alert.  Psychiatric:        Mood and Affect: Mood normal.     ED Results / Procedures / Treatments   Labs (all labs ordered are listed, but only abnormal results are displayed) Labs Reviewed  CBC WITH DIFFERENTIAL/PLATELET - Abnormal; Notable for the following components:      Result Value   WBC 3.7 (*)    RBC 3.85 (*)    Hemoglobin 10.8 (*)    HCT 33.5 (*)    Lymphs Abs 0.5 (*)    All other components within normal limits  COMPREHENSIVE METABOLIC PANEL - Abnormal; Notable for the following components:   Glucose, Bld 114 (*)    All other  components within normal limits  MAGNESIUM - Abnormal; Notable for the following components:   Magnesium 1.6 (*)    All other components within normal limits  URINALYSIS, ROUTINE W REFLEX MICROSCOPIC - Abnormal; Notable for the following components:   Glucose, UA >=500 (*)    All other components within normal limits  URINALYSIS, MICROSCOPIC (REFLEX) - Abnormal; Notable for the following components:   Bacteria, UA RARE (*)    All other components within normal limits  TSH  BRAIN NATRIURETIC PEPTIDE  TROPONIN I (HIGH SENSITIVITY)  TROPONIN I (HIGH SENSITIVITY)    EKG EKG Interpretation  Date/Time:  Thursday July 09 2022 12:13:09 EDT Ventricular Rate:  88 PR Interval:  125 QRS Duration: 95 QT Interval:  306 QTC Calculation: 371 R Axis:   30 Text Interpretation: Atrial-sensed ventricular-paced rhythm No further analysis attempted due to paced rhythm when compared to prior, now paced from previous complete heart block. No STEMI Confirmed by Theda Belfast ([id number]) on 07/09/2022 12:24:05 PM  Radiology CT Head Wo Contrast  Result Date: 07/09/2022 CLINICAL DATA:  Head trauma.  Remote craniotomy EXAM: CT HEAD WITHOUT CONTRAST TECHNIQUE: Contiguous axial images were obtained from the base of the skull through the vertex without intravenous contrast. RADIATION DOSE REDUCTION: This exam was performed according to the departmental  dose-optimization program which includes automated exposure control, adjustment of the mA and/or kV according to patient size and/or use of iterative reconstruction technique. COMPARISON:  Head CT 12/08/2020 FINDINGS: Brain: No acute intracranial hemorrhage. No focal mass lesion. No CT evidence of acute infarction. No midline shift or mass effect. No hydrocephalus. Basilar cisterns are patent. There are periventricular and subcortical white matter hypodensities. Generalized cortical atrophy. Vascular: No hyperdense vessel or unexpected calcification. Skull: Normal. Negative for fracture or focal lesion. Sinuses/Orbits: Paranasal sinuses and mastoid air cells are clear. Orbits are clear. Other: Prior RIGHT frontal craniotomy. IMPRESSION: 1. No intracranial trauma. 2. Atrophy and white matter microvascular disease. 3. Remote RIGHT frontal craniotomy Electronically Signed   By: Genevive Bi M.D.   On: 07/09/2022 14:08   DG Chest 2 View  Result Date: 07/09/2022 CLINICAL DATA:  Syncope.  Sarcoidosis. EXAM: CHEST - 2 VIEW COMPARISON:  12/31/2020 FINDINGS: The heart size and mediastinal contours are within normal limits. Prior CABG again noted. Pacemaker has been removed but leads remain in place. Multifocal poorly defined nodular opacities are seen bilaterally which show improvement since previous study. This is most consistent with parenchymal involvement by sarcoidosis. No evidence of acute infiltrate or pleural effusion. IMPRESSION: Interval improvement in bilateral poorly defined nodular opacities, most consistent with parenchymal involvement by sarcoidosis. No acute findings. Electronically Signed   By: Danae Orleans M.D.   On: 07/09/2022 13:52    Procedures Procedures    Medications Ordered in ED Medications  sodium chloride 0.9 % bolus 1,000 mL (1,000 mLs Intravenous New Bag/Given 07/09/22 1338)    ED Course/ Medical Decision Making/ A&P                             Medical Decision  Making Amount and/or Complexity of Data Reviewed Labs: ordered. Radiology: ordered.    Jennifer Richards is a 57 y.o. female with a past medical history significant for asthma, hypertension, previous pulmonary embolism on Eliquis therapy, previous subdural and craniotomy, pacemaker dependence with previous sinus arrest, obesity, and sarcoidosis who presents with syncope.  According to patient, she has had diarrhea for the last few weeks  ever since starting a new medication and thinks she is slightly dehydrated.  She reports that she woke at 3 AM to go use the restroom and after standing up got very lightheaded.  She reports that she tried to sit on the side of the bed and reached for something but then woke up on the ground.  She denied any chest pain, palpitations or shortness of breath preceding or since then.  She says she would like only out for several seconds for her husband helped her.  She says that she has not had any other symptoms such as nausea, vomiting, or constipation today.  Denies any urinary changes.  She reports she had some vomiting when she for started the medication but then has not had any.  She denies any fevers, chills, congestion, cough.  Denies other trauma.  She did hit her head during the fall and had a bump on the back of her head this morning.  She is on blood thinners.  She otherwise denies any vision changes or neurologic complaints.  Due to her lightheadedness and syncope she wanted to get evaluated.  On exam, lungs clear.  Chest nontender.  Abdomen nontender.  Good pulses in extremities.  Legs are nontender and nonedematous.  She does have dry mucous membranes in her mouth.  Tenderness on the back of her head with a small hematoma but no laceration seen.  No neck or back tenderness.  I do not appreciate a murmur.  EKG shows a paced rhythm.  Clinically I suspect that the diarrhea for the last few weeks likely some dehydration and this led to her recurrent near  syncope/syncopal episode early this morning.  We will give her some fluids and get screening labs to look for other causes of near syncope and syncope and lightheadedness.  Will get CT head given her blood thinner use and head injury.  Will get chest x-ray and labs.  Will get urinalysis.  The patient is feeling better after fluids and workup is reassuring, she may be a candidate for discharge home.  Her pacemaker was interrogated and [employer] called to report there have been no arrhythmias or problems since it was last checked just over a week ago.  If workup reassuring, dissipate discharge.    Workup returned overall reassuring.  Troponin negative x 2, TSH normal.  BNP not elevated.  Metabolic panel and CBC did not show significant changes from prior.  Similar anemia to prior.  Urinalysis did not show evidence of UTI with no nitrites or leukocytes.  X-ray and CT head reassuring without evidence of acute pneumonia developing.  Magnesium slightly low and we discussed dietary changes to help with this.  Patient felt much better after being here for over 4 hours.  She felt better after fluids.  She suspect she was dehydrated due to the recent diarrhea and this led to the near syncopal/syncopal episode.  She feels reassured given the pacemaker reports having no significant arrhythmia and would like to go home.  We discussed that I could call cardiology given her cardiac history but she would rather go home and call them as an outpatient.  She will increase her hydration and rest and stay hydrated.  She had no other questions or concerns and was discharged in good condition.   Final Clinical Impression(s) / ED Diagnoses Final diagnoses:  Syncope, unspecified syncope type  Diarrhea, unspecified type    Rx / DC Orders ED Discharge Orders     None  Sireen Halk, Canary Brim, MD 07/09/22 1609   Clinical Impression: 1. Syncope, unspecified syncope type   2. Diarrhea, unspecified type      Disposition: Discharge  Condition: Good  I have discussed the results, Dx and Tx plan with the pt(& family if present). He/she/they expressed understanding and agree(s) with the plan. Discharge instructions discussed at great length. Strict return precautions discussed and pt &/or family have verbalized understanding of the instructions. No further questions at time of discharge.    Discharge Medication List as of 07/09/2022  3:57 PM      Follow Up: Gwenyth Bender, MD [address] [place] [address] [phone]     [facility] Emergency Department at [facility] [address] [place] [address] [phone]       Emmylou Bieker, Canary Brim, MD 07/09/22 [phone]

## 2022-07-09 NOTE — ED Notes (Signed)
Interrogated pacemaker 

## 2022-07-09 NOTE — ED Notes (Signed)
Patient transported to CT 

## 2023-01-16 ENCOUNTER — Other Ambulatory Visit: Payer: Self-pay | Admitting: Internal Medicine

## 2023-01-16 ENCOUNTER — Ambulatory Visit
Admission: RE | Admit: 2023-01-16 | Discharge: 2023-01-16 | Disposition: A | Discharge status: Home / Self Care | Payer: BC Managed Care – PPO | Source: Ambulatory Visit | Attending: Internal Medicine | Admitting: Internal Medicine

## 2023-01-16 DIAGNOSIS — Z1231 Encounter for screening mammogram for malignant neoplasm of breast: Secondary | ICD-10-CM

## 2023-06-09 ENCOUNTER — Encounter: Payer: Self-pay | Admitting: Family Medicine

## 2023-11-22 IMAGING — CT CT CHEST W/O CM
2 of 4 series · 15 of 36 positions shown, 18 images · non-contrast
Comparison: None.

CLINICAL DATA: Sarcoidosis and cough



[Series 2: thorax · axial · 0.64mm/px · z∈[-238,-22]mm · 12 of 128 slices shown, 15 images]
[im 10/128  mediastinal]
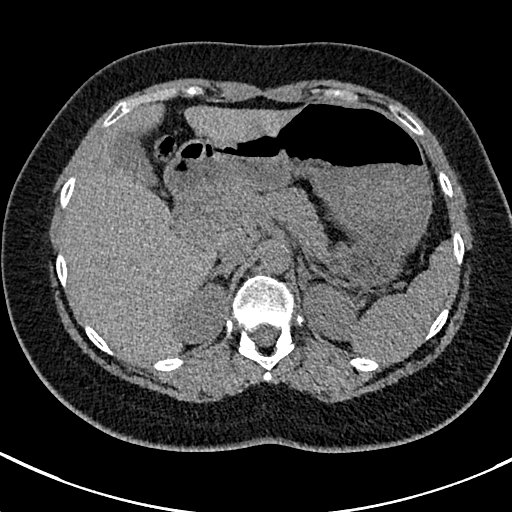
[im 10/128  lung]
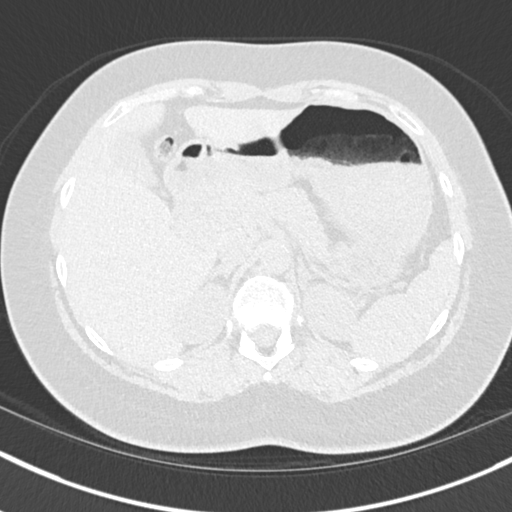
[im 20/128  lung]
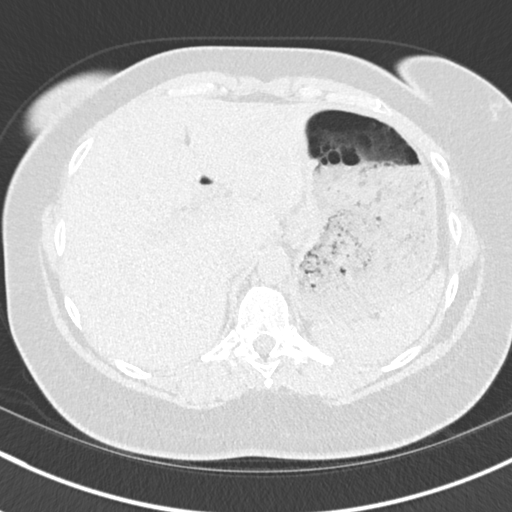
[im 30/128  lung]
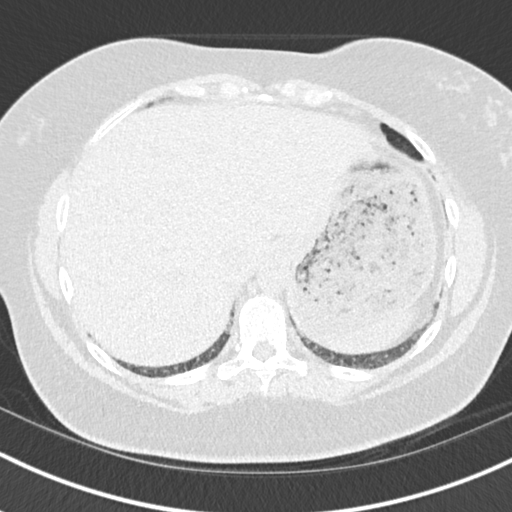
[im 40/128  lung]
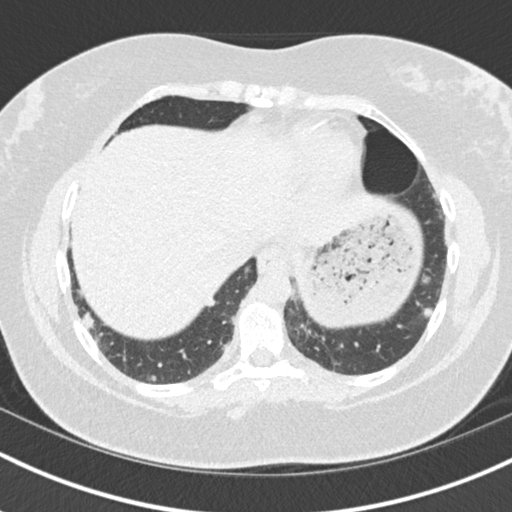
[im 49/128  mediastinal]
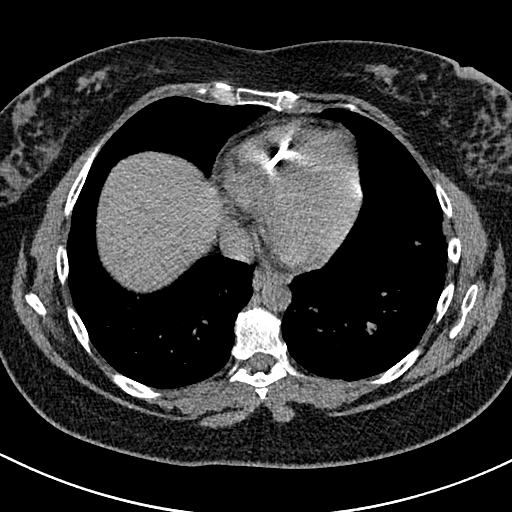
[im 49/128  lung]
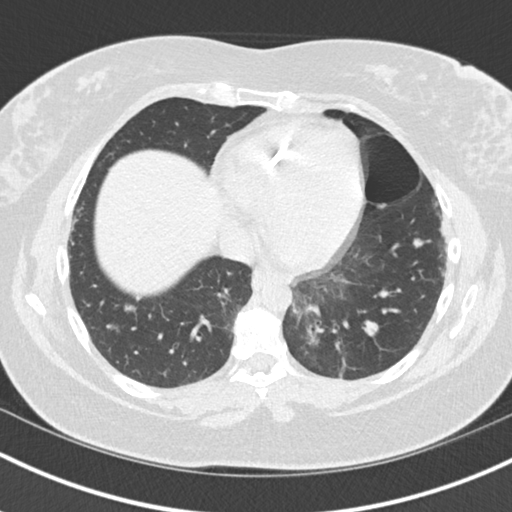
[im 59/128  lung]
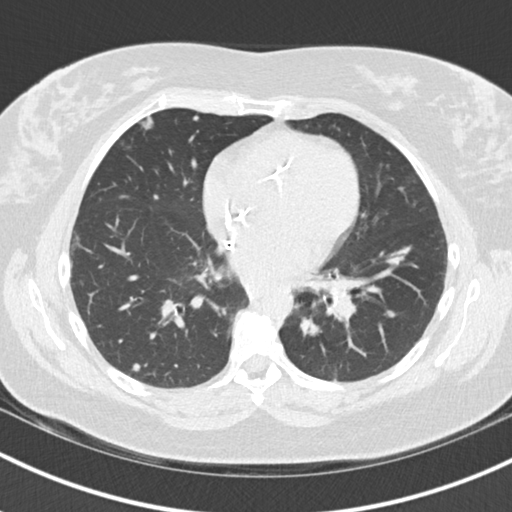
[im 69/128  lung]
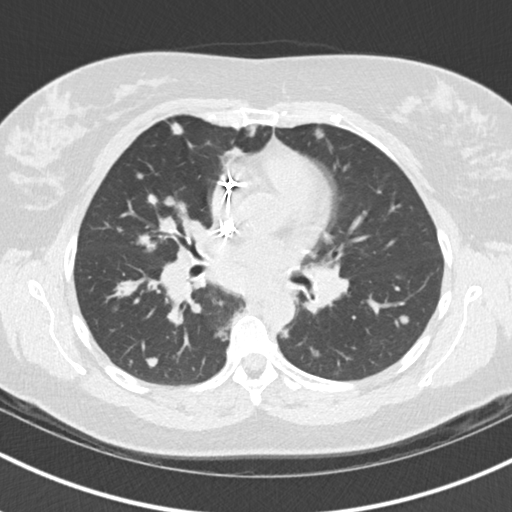
[im 79/128  lung]
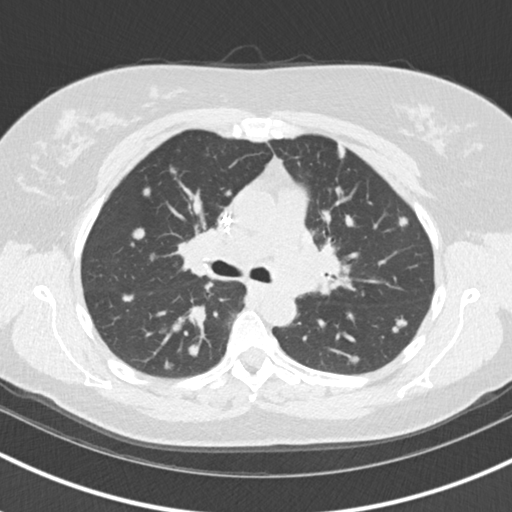
[im 88/128  mediastinal]
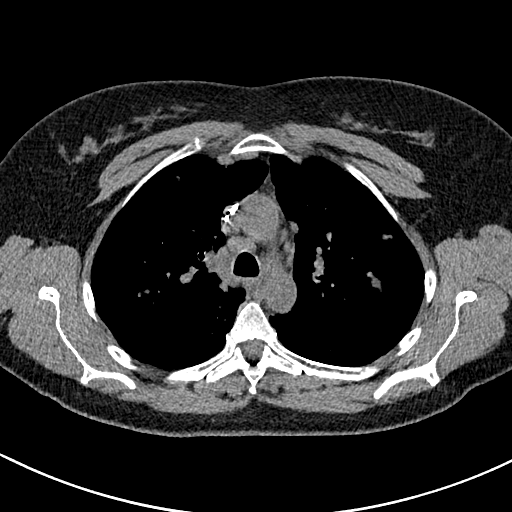
[im 88/128  lung]
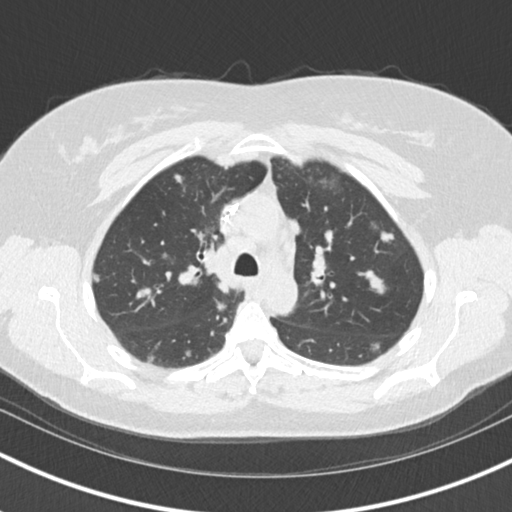
[im 98/128  lung]
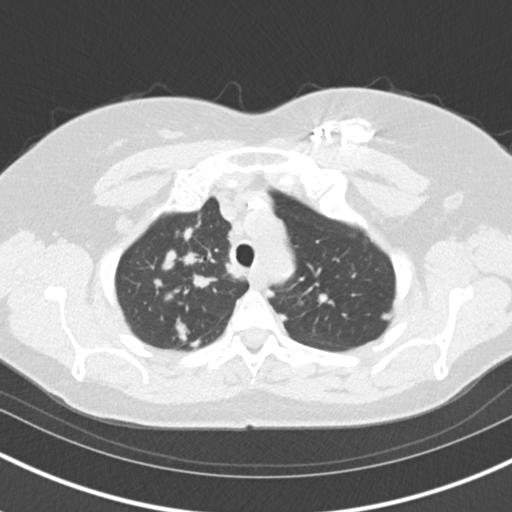
[im 108/128  lung]
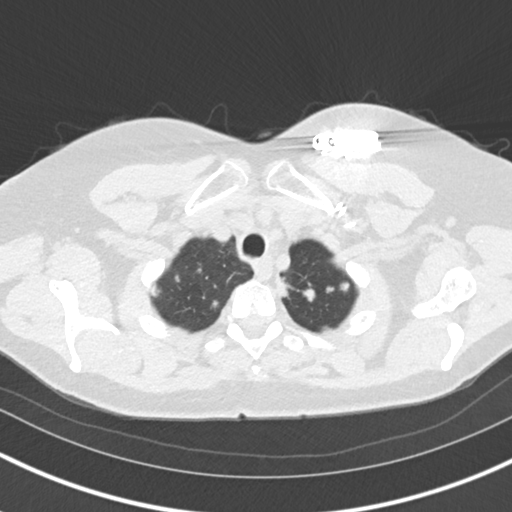
[im 118/128  lung]
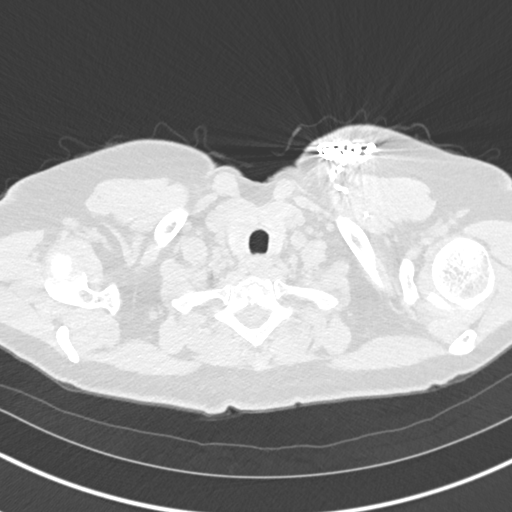

[Series 5: coronal · coronal · 0.53mm/px · 3 of 112 slices shown]
[im 23/112  lung]
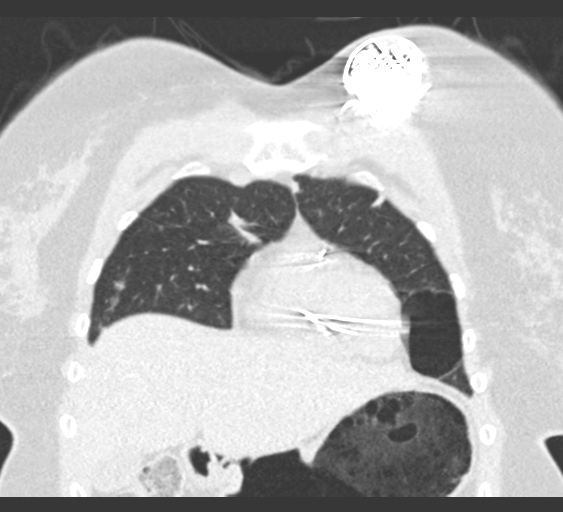
[im 45/112  lung]
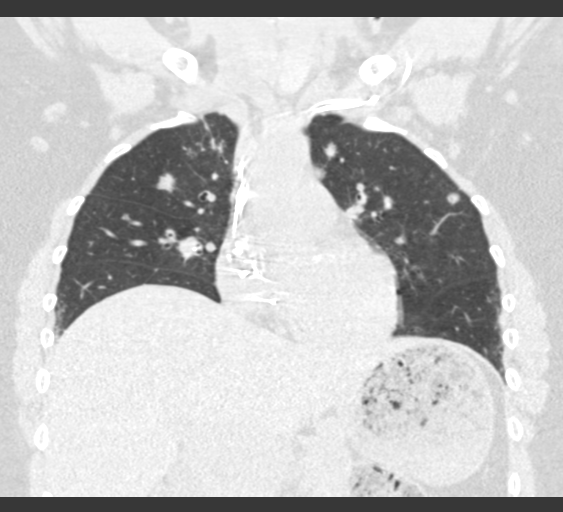
[im 67/112  lung]
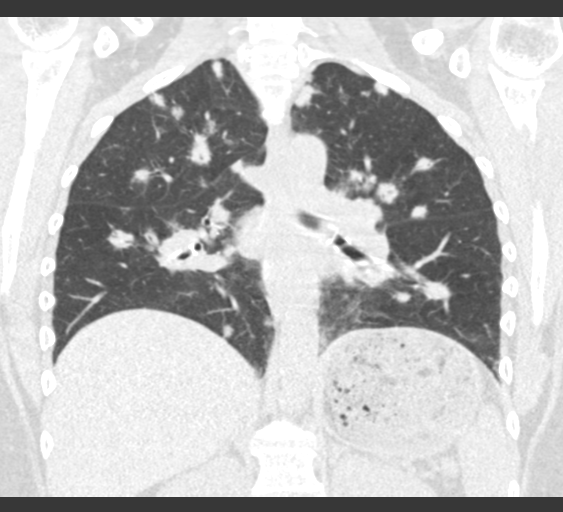

[15 of 36 positions shown; findings below may reference images not displayed]

FINDINGS: Cardiovascular: Normal heart size. No pericardial effusion. Left
chest wall pacer with leads in the right atrium and right ventricle.
Mild calcified plaque of the thoracic aorta. No definite coronary
artery calcifications.

Mediastinum/Nodes: Esophagus and thyroid are unremarkable. Enlarged
mediastinal lymph nodes. Reference subcarinal lymph node measuring
1.3 cm on series 2, image 61.

Lungs/Pleura: Central airways are patent. Numerous
peribronchovascular/perilymphatic irregular nodules are seen
throughout the lungs. Reference peribronchovascular nodule of the
right middle lobe measuring 1.7 x 0.9 cm on series 3, image 61. Cyst
of the left lower lobe, likely a pneumatocele.

Upper Abdomen: No acute abnormality.

Musculoskeletal: No chest wall mass or suspicious bone lesions
identified.
IMPRESSION: 1. Numerous irregular solidperibronchovascular/perilymphatic
pulmonary nodules are seen throughout the lungs, findings are likely
due to sarcoidosis. Recommend follow-up chest CT in 3 months to
assess stability.
2. Mildly enlarged mediastinal lymph nodes, findings can be seen in
the setting of sarcoidosis.
3. Aortic Atherosclerosis ([id number]).

## 2024-02-09 IMAGING — CT CT CHEST SUPER D W/O CM
2 of 5 series · 15 of 36 positions shown, 18 images · non-contrast
Comparison: 04/09/2021

CLINICAL DATA: Follow-up pulmonary nodules, history of sarcoidosis

EXAM:
CT CHEST WITHOUT CONTRAST
TECHNIQUE: Multidetector CT imaging of the chest was performed using thin slice
collimation for electromagnetic bronchoscopy planning purposes,
without intravenous contrast.
RADIATION DOSE REDUCTION: This exam was performed according to the
departmental dose-optimization program which includes automated
exposure control, adjustment of the mA and/or kV according to
patient size and/or use of iterative reconstruction technique.

[Series 4: thins · axial · 0.67mm/px · z∈[-230,-19]mm · 12 of 305 slices shown, 15 images]
[im 21/305  mediastinal]
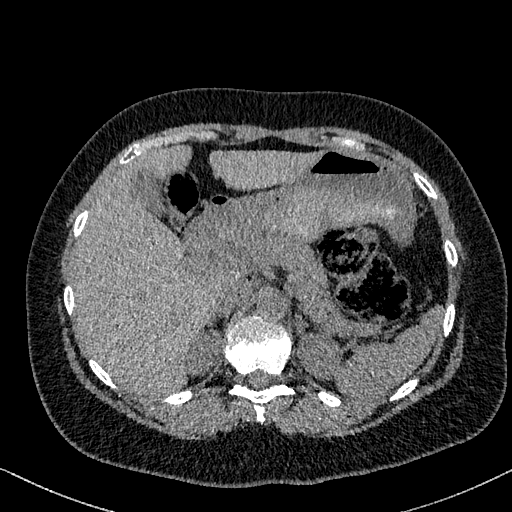
[im 21/305  lung]
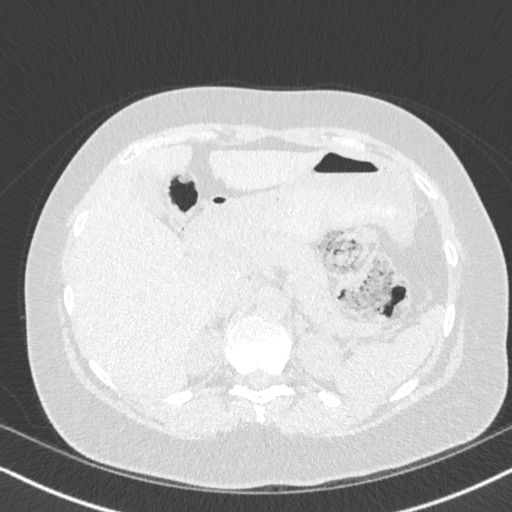
[im 41/305  lung]
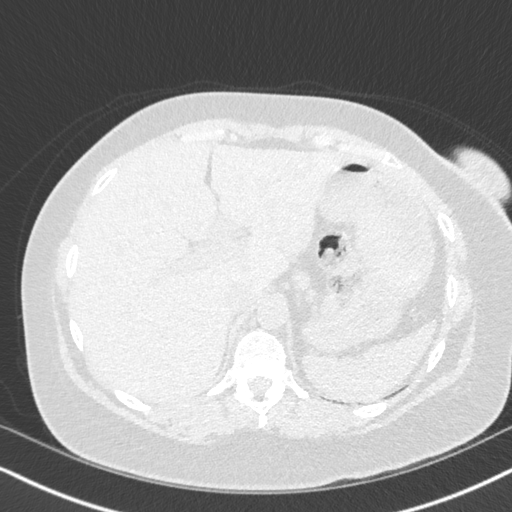
[im 61/305  lung]
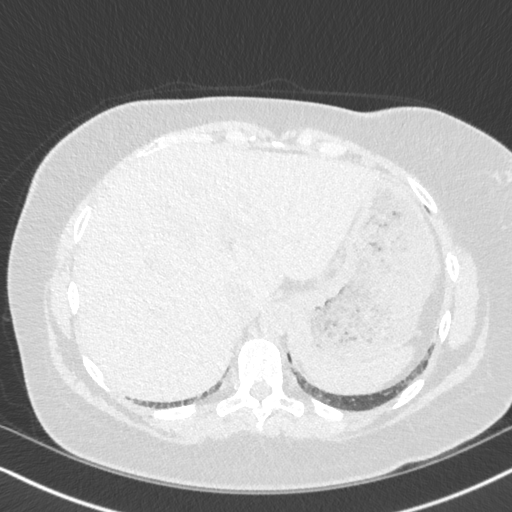
[im 102/305  lung]
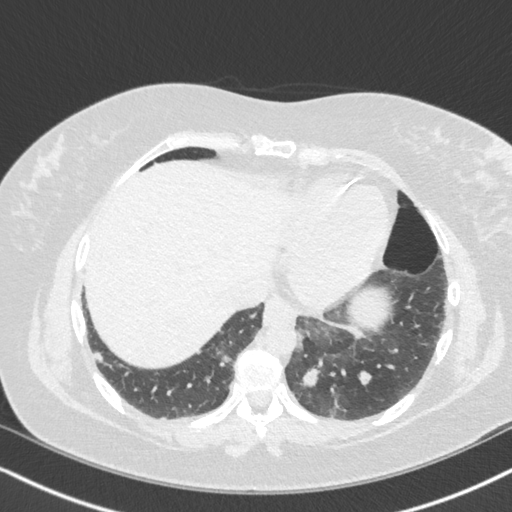
[im 122/305  mediastinal]
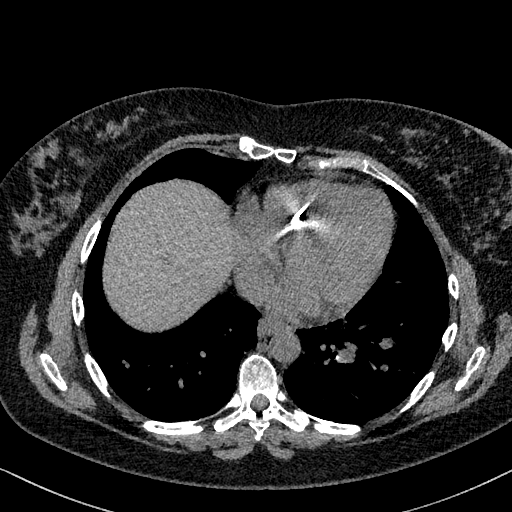
[im 122/305  lung]
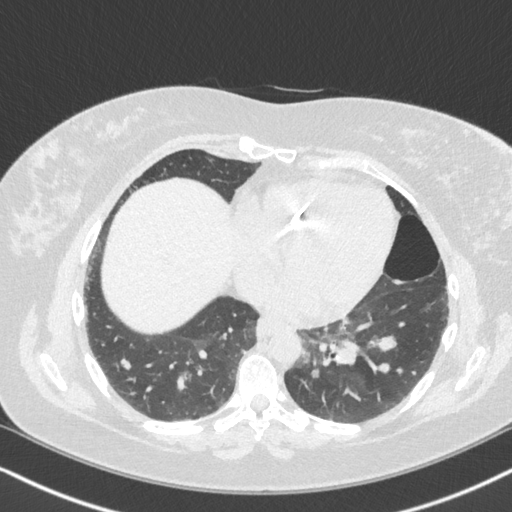
[im 142/305  lung]
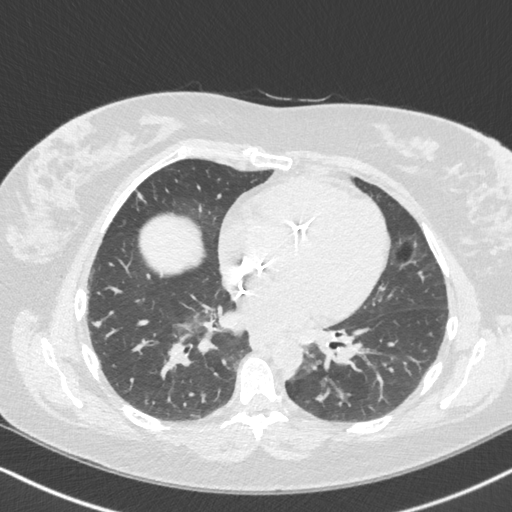
[im 163/305  lung]
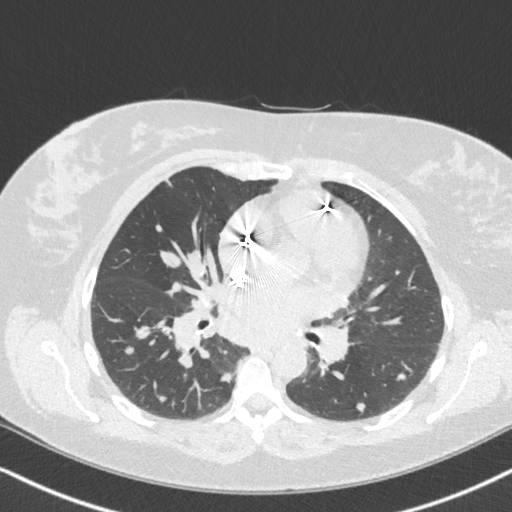
[im 183/305  lung]
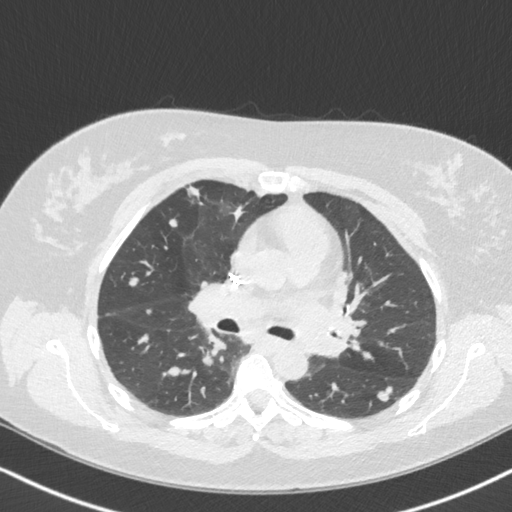
[im 203/305  mediastinal]
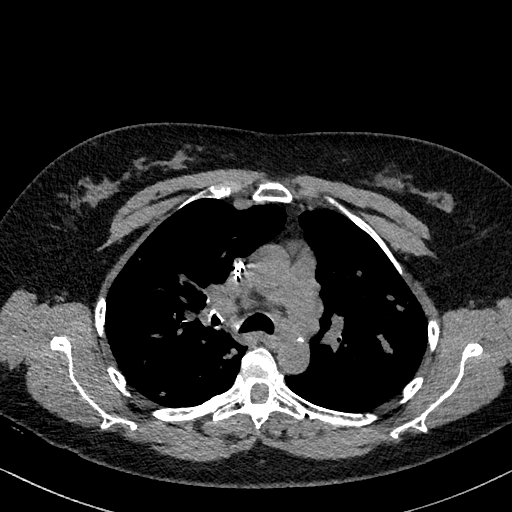
[im 203/305  lung]
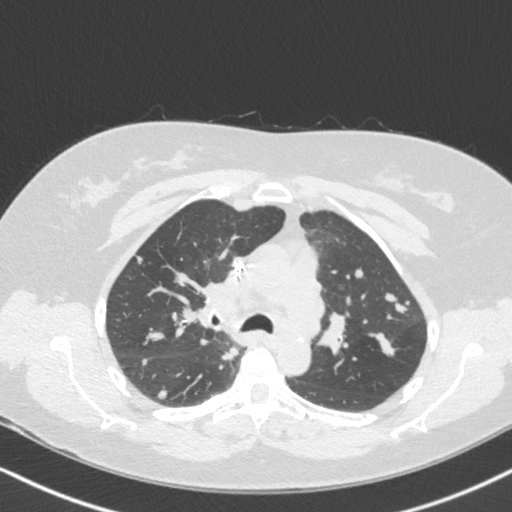
[im 244/305  lung]
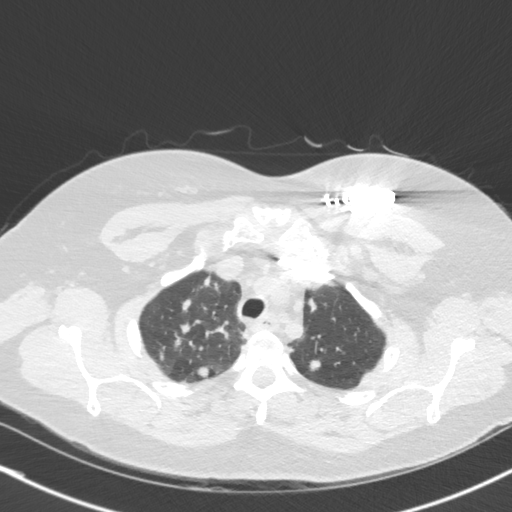
[im 264/305  lung]
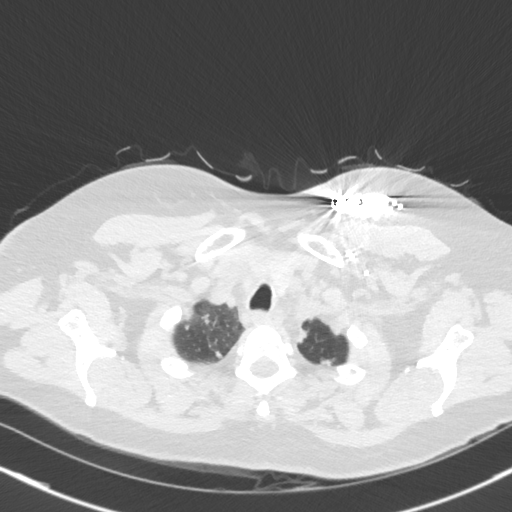
[im 284/305  lung]
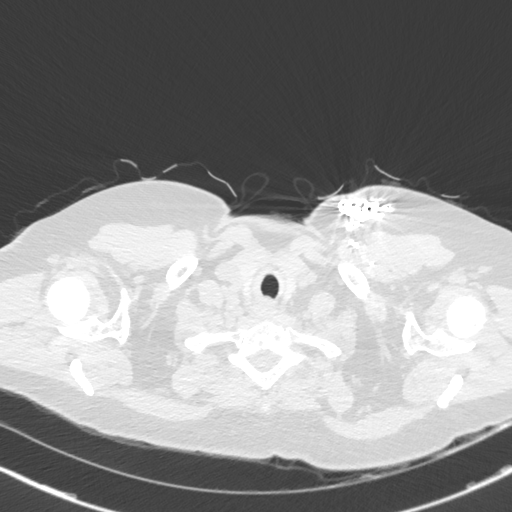

[Series 5: coronal · coronal · 0.49mm/px · 3 of 75 slices shown]
[im 15/75  lung]
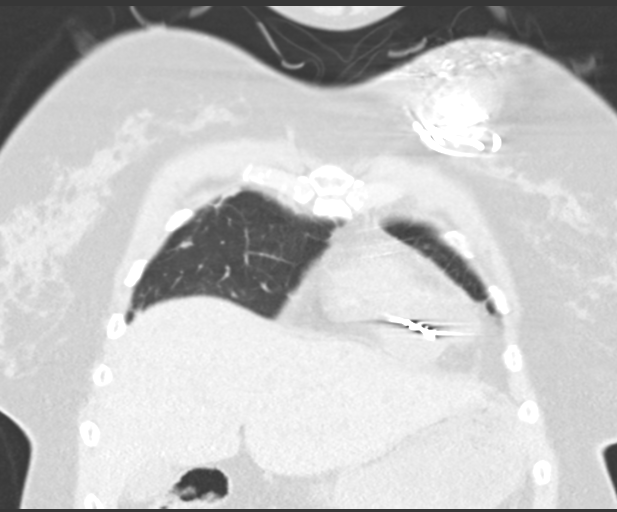
[im 30/75  lung]
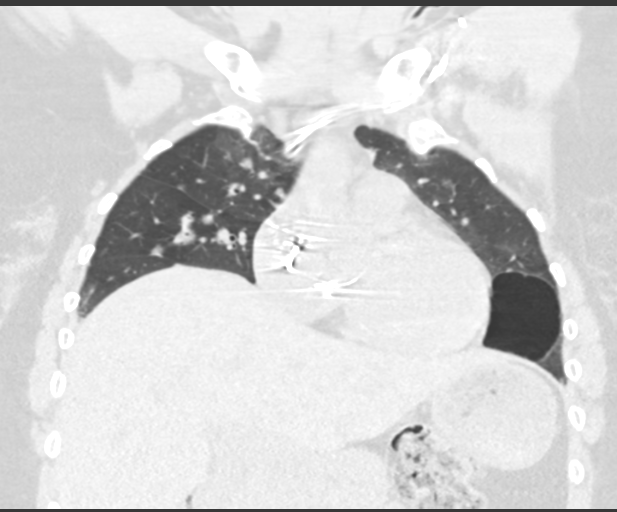
[im 45/75  lung]
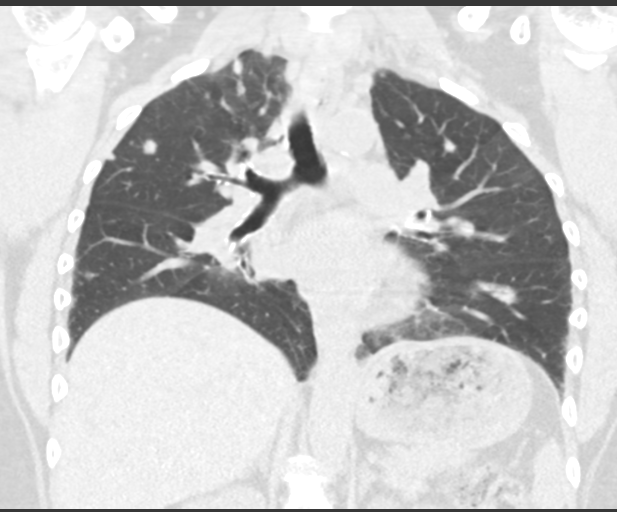

[15 of 36 positions shown; findings below may reference images not displayed]

FINDINGS: Cardiovascular: Left chest multi lead pacer. Scattered aortic
atherosclerosis. Normal heart size. No pericardial effusion.

Mediastinum/Nodes: Prominent mediastinal and bilateral hilar lymph
nodes, unchanged. Thyroid gland, trachea, and esophagus demonstrate
no significant findings.

Lungs/Pleura: Numerous bilateral spiculated pulmonary nodules,
generally in a perilymphatic distribution and unchanged compared to
prior examination. Incidental, benign pneumatocele of the lingula
(series 3, image 96). No pleural effusion or pneumothorax.

Upper Abdomen: No acute abnormality.

Musculoskeletal: No chest wall abnormality. No suspicious osseous
lesions identified.
IMPRESSION: 1. Numerous bilateral spiculated pulmonary nodules, generally in a
perilymphatic distribution and unchanged compared to prior
examination. Findings are consistent with pulmonary sarcoidosis.
2. Prominent mediastinal and bilateral hilar lymph nodes,
nonspecific although generally in keeping with nodal sarcoidosis.

Aortic Atherosclerosis ([id number]-[id number].E).
